# Patient Record
Sex: Female | Born: 1935 | Race: White | Hispanic: No | Smoking: Former smoker
Health system: Southern US, Community
[De-identification: ages and names within clinical notes are randomized; demographics above are authoritative.]

## PROBLEM LIST (undated history)

## (undated) DIAGNOSIS — J189 Pneumonia, unspecified organism: Secondary | ICD-10-CM

## (undated) DIAGNOSIS — S3992XA Unspecified injury of lower back, initial encounter: Secondary | ICD-10-CM

## (undated) DIAGNOSIS — I4891 Unspecified atrial fibrillation: Secondary | ICD-10-CM

## (undated) DIAGNOSIS — F0390 Unspecified dementia without behavioral disturbance: Secondary | ICD-10-CM

## (undated) HISTORY — PX: CHOLECYSTECTOMY: SHX55

## (undated) HISTORY — PX: ABDOMINAL HYSTERECTOMY: SHX81

---

## 1997-11-13 ENCOUNTER — Other Ambulatory Visit: Admission: RE | Admit: 1997-11-13 | Discharge: 1997-11-13 | Payer: Self-pay | Admitting: Internal Medicine

## 1999-06-09 ENCOUNTER — Encounter: Payer: Self-pay | Admitting: Internal Medicine

## 1999-06-09 ENCOUNTER — Encounter: Admission: RE | Admit: 1999-06-09 | Discharge: 1999-06-09 | Payer: Self-pay | Admitting: Internal Medicine

## 2000-12-13 ENCOUNTER — Other Ambulatory Visit: Admission: RE | Admit: 2000-12-13 | Discharge: 2000-12-13 | Payer: Self-pay | Admitting: Internal Medicine

## 2005-04-30 ENCOUNTER — Other Ambulatory Visit: Admission: RE | Admit: 2005-04-30 | Discharge: 2005-04-30 | Payer: Self-pay | Admitting: Internal Medicine

## 2005-04-30 LAB — HM PAP SMEAR

## 2006-11-01 ENCOUNTER — Encounter: Admission: RE | Admit: 2006-11-01 | Discharge: 2006-11-01 | Payer: Self-pay | Admitting: Internal Medicine

## 2006-11-01 LAB — HM MAMMOGRAPHY

## 2007-05-06 ENCOUNTER — Encounter: Admission: RE | Admit: 2007-05-06 | Discharge: 2007-05-06 | Payer: Self-pay | Admitting: Internal Medicine

## 2008-06-25 ENCOUNTER — Ambulatory Visit: Payer: Self-pay | Admitting: Internal Medicine

## 2011-01-27 ENCOUNTER — Encounter: Payer: Self-pay | Admitting: Internal Medicine

## 2011-01-27 ENCOUNTER — Ambulatory Visit (INDEPENDENT_AMBULATORY_CARE_PROVIDER_SITE_OTHER): Payer: Medicare Other | Admitting: Internal Medicine

## 2011-01-27 VITALS — BP 128/70 | HR 80 | Temp 99.4°F | Ht 60.75 in | Wt 113.0 lb

## 2011-01-27 DIAGNOSIS — H8110 Benign paroxysmal vertigo, unspecified ear: Secondary | ICD-10-CM

## 2011-01-27 DIAGNOSIS — M549 Dorsalgia, unspecified: Secondary | ICD-10-CM

## 2011-01-27 LAB — POCT URINALYSIS DIPSTICK
Bilirubin, UA: NEGATIVE
Blood, UA: NEGATIVE
Glucose, UA: NEGATIVE
Ketones, UA: NEGATIVE
Leukocytes, UA: NEGATIVE
Nitrite, UA: NEGATIVE
Protein, UA: NEGATIVE
Spec Grav, UA: 1.02
Urobilinogen, UA: NEGATIVE
pH, UA: 7

## 2011-01-30 ENCOUNTER — Other Ambulatory Visit: Payer: Medicare Other | Admitting: Internal Medicine

## 2011-01-30 DIAGNOSIS — R5381 Other malaise: Secondary | ICD-10-CM

## 2011-01-30 DIAGNOSIS — R5383 Other fatigue: Secondary | ICD-10-CM

## 2011-01-30 DIAGNOSIS — Z79899 Other long term (current) drug therapy: Secondary | ICD-10-CM

## 2011-01-30 LAB — COMPREHENSIVE METABOLIC PANEL
ALT: 16 U/L (ref 0–35)
AST: 20 U/L (ref 0–37)
Albumin: 4.1 g/dL (ref 3.5–5.2)
Alkaline Phosphatase: 60 U/L (ref 39–117)
BUN: 14 mg/dL (ref 6–23)
CO2: 29 mEq/L (ref 19–32)
Calcium: 10.4 mg/dL (ref 8.4–10.5)
Chloride: 104 mEq/L (ref 96–112)
Creat: 0.82 mg/dL (ref 0.50–1.10)
Glucose, Bld: 87 mg/dL (ref 70–99)
Potassium: 4.7 mEq/L (ref 3.5–5.3)
Sodium: 142 mEq/L (ref 135–145)
Total Bilirubin: 0.5 mg/dL (ref 0.3–1.2)
Total Protein: 6 g/dL (ref 6.0–8.3)

## 2011-01-30 LAB — CBC WITH DIFFERENTIAL/PLATELET
Basophils Absolute: 0.1 10*3/uL (ref 0.0–0.1)
Basophils Relative: 1 % (ref 0–1)
Eosinophils Absolute: 0.1 10*3/uL (ref 0.0–0.7)
Eosinophils Relative: 2 % (ref 0–5)
HCT: 37.5 % (ref 36.0–46.0)
Hemoglobin: 12.3 g/dL (ref 12.0–15.0)
Lymphocytes Relative: 24 % (ref 12–46)
Lymphs Abs: 1.4 10*3/uL (ref 0.7–4.0)
MCH: 30.1 pg (ref 26.0–34.0)
MCHC: 32.8 g/dL (ref 30.0–36.0)
MCV: 91.7 fL (ref 78.0–100.0)
Monocytes Absolute: 0.6 10*3/uL (ref 0.1–1.0)
Monocytes Relative: 9 % (ref 3–12)
Neutro Abs: 3.7 10*3/uL (ref 1.7–7.7)
Neutrophils Relative %: 63 % (ref 43–77)
Platelets: 217 10*3/uL (ref 150–400)
RBC: 4.09 MIL/uL (ref 3.87–5.11)
RDW: 13.5 % (ref 11.5–15.5)
WBC: 5.9 10*3/uL (ref 4.0–10.5)

## 2011-01-30 LAB — LIPID PANEL
Cholesterol: 191 mg/dL (ref 0–200)
HDL: 64 mg/dL (ref >39)
LDL Cholesterol: 111 mg/dL — ABNORMAL HIGH (ref 0–99)
Triglycerides: 82 mg/dL (ref <150)
VLDL: 16 mg/dL (ref 0–40)

## 2011-02-16 ENCOUNTER — Encounter: Payer: Self-pay | Admitting: Internal Medicine

## 2011-02-16 NOTE — Patient Instructions (Signed)
Take anti-inflammatory if needed for back pain. Otherwise treat conservatively. May take Antivert 25 mg at bedtime for dizziness. Be careful with position change. Come in soon for fasting lab work.

## 2011-02-16 NOTE — Progress Notes (Signed)
  Subjective:    Patient ID: Amanda Atkins, female    DOB: 08/11/1924, 77 y.o.   MRN: [id number]  HPI generally healthy 77 year old white female who takes lots of vitamin supplements in today complaining of some back pain and positional vertigo.    Review of Systems noncontributory except for some lightheadedness with position change. Pain in right paralumbar area     Objective:   Physical Exam straight leg raising is negative at 90 bilaterally; deep tendon reflexes 2+ and symmetrical in the knees-1+ and symmetrical in the ankles. Muscle strength in the lower extremities is 5 over 5 in all groups tested. Sensation is intact in the lower extremities. No radiculopathy symptoms. She has pain over her right posterior superior iliac spine. Brief neurological exam shows no focal deficits. Cranial nerves II through XII are grossly intact. There is no nystagmus. Chest is clear; cardiac exam regular rate and rhythm.        Assessment & Plan:  Benign positional vertigo  Musculoskeletal low back pain  Plan: Patient may use heating pad on back, if symptoms persist refer to physical therapy. May take Aleve over-the-counter for musculoskeletal pain. For vertigo, may take Antivert 25 mg at bedtime. Be careful with position change. Patient will return soon for fasting lab work. We've not done lab work on her in some time and have not seen her in some time.

## 2011-04-27 DIAGNOSIS — S060X9A Concussion with loss of consciousness of unspecified duration, initial encounter: Secondary | ICD-10-CM | POA: Diagnosis not present

## 2011-04-27 DIAGNOSIS — S0993XA Unspecified injury of face, initial encounter: Secondary | ICD-10-CM | POA: Diagnosis not present

## 2011-04-27 DIAGNOSIS — S32009A Unspecified fracture of unspecified lumbar vertebra, initial encounter for closed fracture: Secondary | ICD-10-CM | POA: Diagnosis not present

## 2011-04-27 DIAGNOSIS — S199XXA Unspecified injury of neck, initial encounter: Secondary | ICD-10-CM | POA: Diagnosis not present

## 2011-04-27 DIAGNOSIS — IMO0002 Reserved for concepts with insufficient information to code with codable children: Secondary | ICD-10-CM | POA: Diagnosis not present

## 2011-04-27 DIAGNOSIS — M542 Cervicalgia: Secondary | ICD-10-CM | POA: Diagnosis not present

## 2011-04-27 DIAGNOSIS — R937 Abnormal findings on diagnostic imaging of other parts of musculoskeletal system: Secondary | ICD-10-CM | POA: Diagnosis not present

## 2011-04-28 DIAGNOSIS — S22009A Unspecified fracture of unspecified thoracic vertebra, initial encounter for closed fracture: Secondary | ICD-10-CM | POA: Diagnosis present

## 2011-04-28 DIAGNOSIS — Z9181 History of falling: Secondary | ICD-10-CM | POA: Diagnosis not present

## 2011-04-28 DIAGNOSIS — X58XXXA Exposure to other specified factors, initial encounter: Secondary | ICD-10-CM | POA: Diagnosis not present

## 2011-04-28 DIAGNOSIS — S32009A Unspecified fracture of unspecified lumbar vertebra, initial encounter for closed fracture: Secondary | ICD-10-CM | POA: Diagnosis not present

## 2011-05-08 DIAGNOSIS — S32009A Unspecified fracture of unspecified lumbar vertebra, initial encounter for closed fracture: Secondary | ICD-10-CM | POA: Diagnosis not present

## 2011-05-08 DIAGNOSIS — R269 Unspecified abnormalities of gait and mobility: Secondary | ICD-10-CM | POA: Diagnosis not present

## 2011-05-12 ENCOUNTER — Other Ambulatory Visit: Payer: Self-pay | Admitting: Neurology

## 2011-05-12 DIAGNOSIS — R269 Unspecified abnormalities of gait and mobility: Secondary | ICD-10-CM

## 2011-05-12 DIAGNOSIS — S32009A Unspecified fracture of unspecified lumbar vertebra, initial encounter for closed fracture: Secondary | ICD-10-CM

## 2011-05-18 ENCOUNTER — Ambulatory Visit
Admission: RE | Admit: 2011-05-18 | Discharge: 2011-05-18 | Disposition: A | Discharge status: Home / Self Care | Payer: Medicare Other | Source: Ambulatory Visit | Attending: Neurology | Admitting: Neurology

## 2011-05-18 DIAGNOSIS — S32009A Unspecified fracture of unspecified lumbar vertebra, initial encounter for closed fracture: Secondary | ICD-10-CM | POA: Diagnosis not present

## 2011-05-18 DIAGNOSIS — R269 Unspecified abnormalities of gait and mobility: Secondary | ICD-10-CM

## 2011-05-28 ENCOUNTER — Ambulatory Visit: Payer: Medicare Other | Attending: Neurology | Admitting: Physical Therapy

## 2011-05-28 DIAGNOSIS — IMO0001 Reserved for inherently not codable concepts without codable children: Secondary | ICD-10-CM | POA: Diagnosis not present

## 2011-05-28 DIAGNOSIS — M545 Low back pain, unspecified: Secondary | ICD-10-CM | POA: Insufficient documentation

## 2011-05-28 DIAGNOSIS — R5381 Other malaise: Secondary | ICD-10-CM | POA: Insufficient documentation

## 2011-06-02 ENCOUNTER — Ambulatory Visit: Payer: Medicare Other | Admitting: Physical Therapy

## 2011-06-04 ENCOUNTER — Encounter: Payer: Medicare Other | Admitting: Physical Therapy

## 2011-06-04 ENCOUNTER — Ambulatory Visit: Payer: Medicare Other | Admitting: Physical Therapy

## 2011-06-09 ENCOUNTER — Ambulatory Visit: Payer: Medicare Other | Admitting: Physical Therapy

## 2011-06-11 ENCOUNTER — Ambulatory Visit: Payer: Medicare Other | Admitting: Physical Therapy

## 2011-06-16 ENCOUNTER — Ambulatory Visit: Payer: Medicare Other | Admitting: Physical Therapy

## 2011-06-18 ENCOUNTER — Ambulatory Visit: Payer: Medicare Other | Attending: Neurology | Admitting: Physical Therapy

## 2011-06-18 ENCOUNTER — Encounter: Payer: Medicare Other | Admitting: Physical Therapy

## 2011-06-18 DIAGNOSIS — R5381 Other malaise: Secondary | ICD-10-CM | POA: Diagnosis not present

## 2011-06-18 DIAGNOSIS — M545 Low back pain, unspecified: Secondary | ICD-10-CM | POA: Insufficient documentation

## 2011-06-18 DIAGNOSIS — IMO0001 Reserved for inherently not codable concepts without codable children: Secondary | ICD-10-CM | POA: Insufficient documentation

## 2011-06-23 ENCOUNTER — Encounter: Payer: Medicare Other | Admitting: Physical Therapy

## 2011-06-25 ENCOUNTER — Encounter: Payer: Medicare Other | Admitting: Physical Therapy

## 2011-06-30 ENCOUNTER — Encounter: Payer: Medicare Other | Admitting: Physical Therapy

## 2011-07-02 ENCOUNTER — Encounter: Payer: Medicare Other | Admitting: Physical Therapy

## 2011-07-16 ENCOUNTER — Encounter (HOSPITAL_COMMUNITY): Payer: Self-pay

## 2011-07-16 ENCOUNTER — Emergency Department (HOSPITAL_COMMUNITY)
Admission: EM | Admit: 2011-07-16 | Discharge: 2011-07-16 | Discharge status: Against Medical Advice | Payer: Medicare Other | Attending: Emergency Medicine | Admitting: Emergency Medicine

## 2011-07-16 ENCOUNTER — Other Ambulatory Visit: Payer: Self-pay

## 2011-07-16 ENCOUNTER — Telehealth: Payer: Self-pay | Admitting: Internal Medicine

## 2011-07-16 DIAGNOSIS — I959 Hypotension, unspecified: Secondary | ICD-10-CM | POA: Insufficient documentation

## 2011-07-16 DIAGNOSIS — R61 Generalized hyperhidrosis: Secondary | ICD-10-CM | POA: Insufficient documentation

## 2011-07-16 DIAGNOSIS — R404 Transient alteration of awareness: Secondary | ICD-10-CM | POA: Diagnosis not present

## 2011-07-16 DIAGNOSIS — R55 Syncope and collapse: Secondary | ICD-10-CM | POA: Diagnosis not present

## 2011-07-16 DIAGNOSIS — R42 Dizziness and giddiness: Secondary | ICD-10-CM | POA: Diagnosis not present

## 2011-07-16 DIAGNOSIS — Z79899 Other long term (current) drug therapy: Secondary | ICD-10-CM | POA: Diagnosis not present

## 2011-07-16 HISTORY — DX: Unspecified injury of lower back, initial encounter: S39.92XA

## 2011-07-16 LAB — URINE MICROSCOPIC-ADD ON

## 2011-07-16 LAB — CBC
HCT: 34 % — ABNORMAL LOW (ref 36.0–46.0)
Hemoglobin: 11.7 g/dL — ABNORMAL LOW (ref 12.0–15.0)
MCV: 88.8 fL (ref 78.0–100.0)
RDW: 13.8 % (ref 11.5–15.5)
WBC: 5.1 10*3/uL (ref 4.0–10.5)

## 2011-07-16 LAB — URINALYSIS, ROUTINE W REFLEX MICROSCOPIC
Bilirubin Urine: NEGATIVE
Glucose, UA: NEGATIVE mg/dL
Hgb urine dipstick: NEGATIVE
Ketones, ur: 15 mg/dL — AB
Specific Gravity, Urine: 1.026 (ref 1.005–1.030)
pH: 6 (ref 5.0–8.0)

## 2011-07-16 LAB — BASIC METABOLIC PANEL
BUN: 14 mg/dL (ref 6–23)
CO2: 27 mEq/L (ref 19–32)
Chloride: 101 mEq/L (ref 96–112)
Creatinine, Ser: 0.75 mg/dL (ref 0.50–1.10)
GFR calc Af Amer: 88 mL/min — ABNORMAL LOW (ref >90)
Glucose, Bld: 189 mg/dL — ABNORMAL HIGH (ref 70–99)
Potassium: 3.7 mEq/L (ref 3.5–5.1)

## 2011-07-16 LAB — POCT I-STAT TROPONIN I

## 2011-07-16 MED ORDER — SODIUM CHLORIDE 0.9 % IV BOLUS (SEPSIS)
500.0000 mL | Freq: Once | INTRAVENOUS | Status: AC
  Administered 2011-07-16: 500 mL via INTRAVENOUS

## 2011-07-16 NOTE — ED Notes (Addendum)
Dr. Karma Ganja spoke to the patient and family and recommended that the patient be admitted to the hospital but the patient adamantly refuses to stay. Dr. Karma Ganja explained to the patient the risks such as irregular heart beat, recurrent hypotension, heart attack, and possible death which the patient understood.  Pt stated that she is going to call and make an appointment with her physician. Pt is A/A/Ox4, skin is warm and dry, respiration is even and unlabored.
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
## 2011-07-16 NOTE — ED Notes (Signed)
Pt ambulated to the bathroom without any problems.
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
## 2011-07-16 NOTE — ED Notes (Signed)
Pt is A/A/OX4, skin is warm and dry, respiration and unlabored.
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
## 2011-08-10 DIAGNOSIS — S32009A Unspecified fracture of unspecified lumbar vertebra, initial encounter for closed fracture: Secondary | ICD-10-CM | POA: Diagnosis not present

## 2011-08-10 DIAGNOSIS — R269 Unspecified abnormalities of gait and mobility: Secondary | ICD-10-CM | POA: Diagnosis not present

## 2011-09-08 DIAGNOSIS — H905 Unspecified sensorineural hearing loss: Secondary | ICD-10-CM | POA: Diagnosis not present

## 2012-02-16 DIAGNOSIS — H6120 Impacted cerumen, unspecified ear: Secondary | ICD-10-CM | POA: Diagnosis not present

## 2012-02-16 DIAGNOSIS — H9210 Otorrhea, unspecified ear: Secondary | ICD-10-CM | POA: Diagnosis not present

## 2012-02-16 DIAGNOSIS — H60399 Other infective otitis externa, unspecified ear: Secondary | ICD-10-CM | POA: Diagnosis not present

## 2012-02-16 DIAGNOSIS — H903 Sensorineural hearing loss, bilateral: Secondary | ICD-10-CM | POA: Diagnosis not present

## 2012-03-01 DIAGNOSIS — H903 Sensorineural hearing loss, bilateral: Secondary | ICD-10-CM | POA: Diagnosis not present

## 2012-03-01 DIAGNOSIS — H60399 Other infective otitis externa, unspecified ear: Secondary | ICD-10-CM | POA: Diagnosis not present

## 2012-07-04 DIAGNOSIS — L219 Seborrheic dermatitis, unspecified: Secondary | ICD-10-CM | POA: Diagnosis not present

## 2012-12-01 ENCOUNTER — Encounter: Payer: Self-pay | Admitting: Internal Medicine

## 2012-12-01 ENCOUNTER — Ambulatory Visit (INDEPENDENT_AMBULATORY_CARE_PROVIDER_SITE_OTHER): Payer: Medicare Other | Admitting: Internal Medicine

## 2012-12-01 VITALS — BP 138/86 | HR 80 | Temp 98.9°F | Ht 60.0 in | Wt 109.0 lb

## 2012-12-01 DIAGNOSIS — K644 Residual hemorrhoidal skin tags: Secondary | ICD-10-CM | POA: Diagnosis not present

## 2012-12-01 DIAGNOSIS — L259 Unspecified contact dermatitis, unspecified cause: Secondary | ICD-10-CM | POA: Diagnosis not present

## 2012-12-01 DIAGNOSIS — L309 Dermatitis, unspecified: Secondary | ICD-10-CM

## 2012-12-01 NOTE — Addendum Note (Signed)
Addended by: Fayne Mediate on: 12/01/2012 02:30 PM   Modules accepted: Orders

## 2012-12-01 NOTE — Patient Instructions (Signed)
Use ProctoCream-HC in rectal area and perineum 4 times daily until dermatitis and hemorrhoids have healed

## 2012-12-01 NOTE — Progress Notes (Signed)
  Subjective:    Patient ID: Amanda Atkins, female    DOB: [birth year], 79 y.o.   MRN: [id number]  HPI 79 year old white female seen infrequently  here. Says she has had issues with hemorrhoids for the past 2 weeks. Denies being constipated. Takes a lot of vitamins supplements. Probably has some dementia. Continues to drive which is a bit worrisome. She lives alone. Her twin sister is in a nursing facility with dementia.    Review of Systems     Objective:   Physical Exam Rectal exam: Loose stool in rectal vault which is guaiac negative. She has prominent external hemorrhoids that appear to be inflamed but are not actively bleeding. She has excoriations on perineum surrounding the hemorrhoids.       Assessment & Plan:  External hemorrhoids  Excoriations on perineum  Plan: ProctoCream-HC to use in rectal area and perineum 4 times a day until healed. Has when necessary one year refills on the cream

## 2012-12-12 DIAGNOSIS — L259 Unspecified contact dermatitis, unspecified cause: Secondary | ICD-10-CM | POA: Diagnosis not present

## 2013-01-03 ENCOUNTER — Ambulatory Visit (INDEPENDENT_AMBULATORY_CARE_PROVIDER_SITE_OTHER): Payer: Medicare Other | Admitting: Internal Medicine

## 2013-01-03 ENCOUNTER — Encounter: Payer: Self-pay | Admitting: Internal Medicine

## 2013-01-03 VITALS — BP 126/72 | HR 76 | Temp 98.8°F | Ht 60.75 in | Wt 107.0 lb

## 2013-01-03 DIAGNOSIS — B373 Candidiasis of vulva and vagina: Secondary | ICD-10-CM

## 2013-01-03 DIAGNOSIS — L853 Xerosis cutis: Secondary | ICD-10-CM

## 2013-01-03 DIAGNOSIS — L258 Unspecified contact dermatitis due to other agents: Secondary | ICD-10-CM | POA: Diagnosis not present

## 2013-01-04 NOTE — Progress Notes (Signed)
  Subjective:    Patient ID: Amanda Atkins, female    DOB: 03-15-1924, 79 y.o.   MRN: [id number]  HPI Patient complaining of itching in genital area. Says she has a rash. Has had some dysuria. Also complaining of rash on her arms and lower back. This is less itchy. She has dermatitis of the face being treated by Dr. Terri Piedra.    Review of Systems     Objective:   Physical Exam Patient has generalized erythema of the external genital area with satellite lesions consistent with Candida. She has very dry skin on her arms and lower back. Has some erythematous discrete papular areas on forearms bilaterally. Lower back mainly has dry skin that is rough        Assessment & Plan:   Dermatitis back and forearms  Candida infection in genital area  Plan: Lotrisone cream to use twice daily in genital area until healed. Triamcinolone 0.1% 60 g in 4 ounces of use her in cream to use on forearms and lower back 3 times a day sparingly. If symptoms persist, refer to Dr. Terri Piedra  25 minutes spent with patient explaining different types of dermatitis and treatment as well as examining patient.

## 2013-01-04 NOTE — Patient Instructions (Signed)
Use triamcinolone cream and Eucerin on forearms and back 3 times a day sparingly. Use Lotrisone cream and genital area twice daily until rash is healed

## 2013-07-13 ENCOUNTER — Ambulatory Visit (INDEPENDENT_AMBULATORY_CARE_PROVIDER_SITE_OTHER): Payer: Medicare Other

## 2013-07-13 ENCOUNTER — Encounter: Payer: Self-pay | Admitting: Podiatry

## 2013-07-13 ENCOUNTER — Ambulatory Visit (INDEPENDENT_AMBULATORY_CARE_PROVIDER_SITE_OTHER): Payer: Medicare Other | Admitting: Podiatry

## 2013-07-13 VITALS — BP 128/80 | HR 65 | Resp 16

## 2013-07-13 DIAGNOSIS — M2040 Other hammer toe(s) (acquired), unspecified foot: Secondary | ICD-10-CM

## 2013-07-13 DIAGNOSIS — L84 Corns and callosities: Secondary | ICD-10-CM | POA: Diagnosis not present

## 2013-07-13 NOTE — Progress Notes (Signed)
   Subjective:    Patient ID: Amanda Atkins, female    DOB: 1925-01-20, 80 y.o.   MRN: [id number]  HPI Comments: "I had a pain in this right foot but its actually better now"  Patient c/o callused area plantar forefoot and 5th toe right about 3-4 weeks. The area is not sore currently, but would still like it checked. She has not had any treatment, home or professional.  Foot Pain      Review of Systems  HENT: Positive for sinus pressure.   Allergic/Immunologic: Positive for food allergies.  All other systems reviewed and are negative.      Objective:   Physical Exam        Assessment & Plan:

## 2013-07-13 NOTE — Progress Notes (Signed)
Subjective:     Patient ID: Amanda Atkins, female   DOB: July 29, 1924, 80 y.o.   MRN: [id number]  Foot Pain   patient presents concerned because she's had pain around the plantar of her right foot and thinks there is something under there but she cannot see it due to her inability to see the bottom of her foot   Review of Systems  All other systems reviewed and are negative.      Objective:   Physical Exam  Nursing note and vitals reviewed. Constitutional: She is oriented to person, place, and time.  Cardiovascular: Intact distal pulses.   Musculoskeletal: Normal range of motion.  Neurological: She is oriented to person, place, and time.  Skin: Skin is dry.   neurovascular status diminished but intact with patient having diminished range of motion and diminished muscle strength normal for her age. She has mild edema in her ankle region and digital perfusion is good with a keratotic lesion underneath the right foot around the distal fifth metatarsal in a localized area with no proximal erythema edema or drainage     Assessment:     Inflammatory tendinitis with possible stress fracture and a lesion which appears to be due to pressure    Plan:     H&P and x-rays reviewed. Area debrided and no breakdown of tissue noted. Patient will wear supportive shoes and reappoint as needed

## 2014-06-18 DIAGNOSIS — H7311 Chronic myringitis, right ear: Secondary | ICD-10-CM | POA: Diagnosis not present

## 2014-06-18 DIAGNOSIS — H6123 Impacted cerumen, bilateral: Secondary | ICD-10-CM | POA: Diagnosis not present

## 2014-06-18 DIAGNOSIS — H903 Sensorineural hearing loss, bilateral: Secondary | ICD-10-CM | POA: Diagnosis not present

## 2014-07-30 DIAGNOSIS — H6123 Impacted cerumen, bilateral: Secondary | ICD-10-CM | POA: Diagnosis not present

## 2014-07-30 DIAGNOSIS — H903 Sensorineural hearing loss, bilateral: Secondary | ICD-10-CM | POA: Diagnosis not present

## 2014-07-30 DIAGNOSIS — H7311 Chronic myringitis, right ear: Secondary | ICD-10-CM | POA: Diagnosis not present

## 2014-08-28 DIAGNOSIS — H2513 Age-related nuclear cataract, bilateral: Secondary | ICD-10-CM | POA: Diagnosis not present

## 2014-09-04 DIAGNOSIS — H18411 Arcus senilis, right eye: Secondary | ICD-10-CM | POA: Diagnosis not present

## 2014-09-04 DIAGNOSIS — H02839 Dermatochalasis of unspecified eye, unspecified eyelid: Secondary | ICD-10-CM | POA: Diagnosis not present

## 2014-09-04 DIAGNOSIS — H2511 Age-related nuclear cataract, right eye: Secondary | ICD-10-CM | POA: Diagnosis not present

## 2014-09-04 DIAGNOSIS — H524 Presbyopia: Secondary | ICD-10-CM | POA: Diagnosis not present

## 2014-09-04 DIAGNOSIS — H18412 Arcus senilis, left eye: Secondary | ICD-10-CM | POA: Diagnosis not present

## 2014-09-07 DIAGNOSIS — S81002A Unspecified open wound, left knee, initial encounter: Secondary | ICD-10-CM | POA: Diagnosis not present

## 2014-09-07 DIAGNOSIS — S8000XA Contusion of unspecified knee, initial encounter: Secondary | ICD-10-CM | POA: Diagnosis not present

## 2014-11-05 DIAGNOSIS — H2511 Age-related nuclear cataract, right eye: Secondary | ICD-10-CM | POA: Diagnosis not present

## 2014-11-05 DIAGNOSIS — H25011 Cortical age-related cataract, right eye: Secondary | ICD-10-CM | POA: Diagnosis not present

## 2014-11-05 DIAGNOSIS — H269 Unspecified cataract: Secondary | ICD-10-CM | POA: Diagnosis not present

## 2014-11-06 DIAGNOSIS — H25012 Cortical age-related cataract, left eye: Secondary | ICD-10-CM | POA: Diagnosis not present

## 2014-11-06 DIAGNOSIS — H2512 Age-related nuclear cataract, left eye: Secondary | ICD-10-CM | POA: Diagnosis not present

## 2014-11-19 ENCOUNTER — Encounter: Payer: Self-pay | Admitting: Internal Medicine

## 2014-11-19 DIAGNOSIS — H269 Unspecified cataract: Secondary | ICD-10-CM | POA: Diagnosis not present

## 2014-11-19 DIAGNOSIS — H2512 Age-related nuclear cataract, left eye: Secondary | ICD-10-CM | POA: Diagnosis not present

## 2014-11-19 DIAGNOSIS — H25012 Cortical age-related cataract, left eye: Secondary | ICD-10-CM | POA: Diagnosis not present

## 2014-11-19 DIAGNOSIS — Z961 Presence of intraocular lens: Secondary | ICD-10-CM | POA: Diagnosis not present

## 2014-11-19 DIAGNOSIS — H52202 Unspecified astigmatism, left eye: Secondary | ICD-10-CM | POA: Diagnosis not present

## 2014-11-26 ENCOUNTER — Ambulatory Visit: Payer: Medicare Other | Admitting: Internal Medicine

## 2014-11-29 ENCOUNTER — Encounter: Payer: Self-pay | Admitting: Internal Medicine

## 2014-11-29 ENCOUNTER — Ambulatory Visit (INDEPENDENT_AMBULATORY_CARE_PROVIDER_SITE_OTHER): Payer: Medicare Other | Admitting: Internal Medicine

## 2014-11-29 VITALS — BP 106/70 | HR 79 | Temp 99.3°F | Ht 60.0 in | Wt 99.5 lb

## 2014-11-29 DIAGNOSIS — I4891 Unspecified atrial fibrillation: Secondary | ICD-10-CM | POA: Diagnosis not present

## 2014-11-29 DIAGNOSIS — I480 Paroxysmal atrial fibrillation: Secondary | ICD-10-CM | POA: Insufficient documentation

## 2014-11-29 LAB — COMPREHENSIVE METABOLIC PANEL
ALBUMIN: 4.1 g/dL (ref 3.6–5.1)
ALT: 19 U/L (ref 6–29)
AST: 18 U/L (ref 10–35)
Alkaline Phosphatase: 62 U/L (ref 33–130)
BILIRUBIN TOTAL: 0.4 mg/dL (ref 0.2–1.2)
BUN: 13 mg/dL (ref 7–25)
CALCIUM: 9.4 mg/dL (ref 8.6–10.4)
CHLORIDE: 103 mmol/L (ref 98–110)
CO2: 28 mmol/L (ref 20–31)
Creat: 0.76 mg/dL (ref 0.60–0.88)
GLUCOSE: 99 mg/dL (ref 65–99)
POTASSIUM: 4.2 mmol/L (ref 3.5–5.3)
Sodium: 140 mmol/L (ref 135–146)
Total Protein: 6.4 g/dL (ref 6.1–8.1)

## 2014-11-29 LAB — CBC WITH DIFFERENTIAL/PLATELET
Basophils Absolute: 0 10*3/uL (ref 0.0–0.1)
Basophils Relative: 0 % (ref 0–1)
EOS PCT: 1 % (ref 0–5)
Eosinophils Absolute: 0.1 10*3/uL (ref 0.0–0.7)
HEMATOCRIT: 36.1 % (ref 36.0–46.0)
HEMOGLOBIN: 12 g/dL (ref 12.0–15.0)
LYMPHS ABS: 1.1 10*3/uL (ref 0.7–4.0)
LYMPHS PCT: 12 % (ref 12–46)
MCH: 30.2 pg (ref 26.0–34.0)
MCHC: 33.2 g/dL (ref 30.0–36.0)
MCV: 90.7 fL (ref 78.0–100.0)
MONO ABS: 1.3 10*3/uL — AB (ref 0.1–1.0)
MONOS PCT: 14 % — AB (ref 3–12)
MPV: 9.9 fL (ref 8.6–12.4)
NEUTROS ABS: 6.8 10*3/uL (ref 1.7–7.7)
Neutrophils Relative %: 73 % (ref 43–77)
Platelets: 165 10*3/uL (ref 150–400)
RBC: 3.98 MIL/uL (ref 3.87–5.11)
RDW: 14.2 % (ref 11.5–15.5)
WBC: 9.3 10*3/uL (ref 4.0–10.5)

## 2014-11-29 LAB — T4, FREE: Free T4: 1.2 ng/dL (ref 0.80–1.80)

## 2014-11-29 LAB — TSH: TSH: 0.386 u[IU]/mL (ref 0.350–4.500)
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
## 2014-11-30 ENCOUNTER — Telehealth: Payer: Self-pay | Admitting: *Deleted

## 2014-11-30 NOTE — Telephone Encounter (Signed)
Reviewed lab results with patient.

## 2014-12-03 ENCOUNTER — Ambulatory Visit (INDEPENDENT_AMBULATORY_CARE_PROVIDER_SITE_OTHER): Payer: Medicare Other

## 2014-12-03 DIAGNOSIS — I480 Paroxysmal atrial fibrillation: Secondary | ICD-10-CM

## 2014-12-10 ENCOUNTER — Ambulatory Visit (INDEPENDENT_AMBULATORY_CARE_PROVIDER_SITE_OTHER): Payer: Medicare Other | Admitting: Internal Medicine

## 2014-12-10 ENCOUNTER — Encounter: Payer: Self-pay | Admitting: Internal Medicine

## 2014-12-10 VITALS — BP 122/82 | HR 109 | Temp 99.9°F | Ht 60.0 in | Wt 103.0 lb

## 2014-12-10 DIAGNOSIS — J069 Acute upper respiratory infection, unspecified: Secondary | ICD-10-CM

## 2014-12-10 MED ORDER — BENZONATATE 100 MG PO CAPS: 100.0000 mg | ORAL_CAPSULE | Freq: Three times a day (TID) | ORAL | Status: DC

## 2014-12-10 NOTE — Progress Notes (Signed)
   Subjective:    Patient ID: Maren BeachLouise C Guardiola, female    DOB: 01/12/1925, 81 y.o.   MRN: [id number]  HPI Onset of URI symptoms Friday, October 21. Has had  cough that is nonproductive. Daughter was worried about her. Sounds nasally congested. No fever or shaking chills. No sore throat. No recent URIs. Seldom sick. Believes in holistic measures. Recently his been taking vitamin C and drinking raspberry tea. Without town this week with friends.    Review of Systems     Objective:   Physical Exam  Skin warm and dry. Small anterior cervical nodes bilaterally. Chest clear to auscultation without rales or wheezing. Sounds nasally congested and has a congested cough. TMs are chronically scarred and dull. Pharynx not injected.      Assessment & Plan:  Acute URI  Plan: Tessalon Perles 100 mg 3 times daily as needed for cough. Continue vitamin C and drink plenty of fluids. Call if not better by Thursday, October 27

## 2014-12-10 NOTE — Patient Instructions (Signed)
Drink plenty of fluids. Get plenty of rest this week. Continue vitamin C. Tessalon Perles 100 mg 3 times daily as needed for cough. Call if not better by Thursday, October 27

## 2014-12-12 ENCOUNTER — Telehealth: Payer: Self-pay | Admitting: Internal Medicine

## 2014-12-12 MED ORDER — AZITHROMYCIN 250 MG PO TABS: ORAL_TABLET | ORAL | Status: DC

## 2014-12-12 NOTE — Telephone Encounter (Signed)
You had asked patient to call with an update today.  States she is coughing a lot at night; describes it as a dry cough.  States she is blowing her nose a lot, but mostly clear.  States she isn't feeling much better.  States she doesn't feel well at all.  No difficulty breathing, no pain to describe.  Just doesn't feel well, no body aches (except in her back since she started coughing).  No fever or chills.  Her appetite is fine.    Please advise and thanks.

## 2014-12-12 NOTE — Telephone Encounter (Signed)
Please send  in a  Generic Zithromax Z-pak  with directions: 2 po day 1 followed by one po days 2-5 ( #6 tabs) and let patient know.

## 2014-12-15 NOTE — Patient Instructions (Signed)
24 hour Holter monitor to be older. Take one aspirin daily.

## 2015-01-07 ENCOUNTER — Ambulatory Visit: Payer: Medicare Other | Admitting: Internal Medicine

## 2015-01-18 ENCOUNTER — Ambulatory Visit (INDEPENDENT_AMBULATORY_CARE_PROVIDER_SITE_OTHER): Payer: Medicare Other | Admitting: Internal Medicine

## 2015-01-18 ENCOUNTER — Encounter: Payer: Self-pay | Admitting: Internal Medicine

## 2015-01-18 VITALS — BP 130/72 | HR 117 | Temp 97.6°F | Resp 20 | Ht 60.0 in | Wt 101.0 lb

## 2015-01-18 DIAGNOSIS — I480 Paroxysmal atrial fibrillation: Secondary | ICD-10-CM

## 2015-01-18 NOTE — Progress Notes (Signed)
   Subjective:    Patient ID: Amanda Atkins, female    DOB: 23-Nov-1924, 81 y.o.   MRN: [id number]  HPI  Follow-up will paroxysmal atrial fibrillation. She did have a 24-hour Holter monitor which did confirm paroxysmal atrial fib. She is completely asymptomatic and cannot tell when she's in and out of atrial fib. Once again we discussed at length whether or not she wants to take chronic anticoagulation therapy. Has been taking just an aspirin daily.  She lives alone. She had one fall this past year. No injury with that fall. Continues to drive.    Review of Systems     Objective:   Physical Exam  Neck is supple without JVD thyromegaly or carotid bruits. Skin is warm and dry. Chest clear to auscultation. Cardiac exam irregular irregular rhythm consistent with atrial fibrillation. Extremities without edema. She is alert and oriented 3.      Assessment & Plan:  Paroxysmal atrial fib  Plan: After lengthy discussion we have agreed that she will just take aspirin one daily instead of being on chronic anticoagulation due to fall risk and fact she resides alone. Return as needed.

## 2015-01-18 NOTE — Patient Instructions (Signed)
Continue aspirin daily and return as needed.

## 2015-03-05 DIAGNOSIS — H903 Sensorineural hearing loss, bilateral: Secondary | ICD-10-CM | POA: Diagnosis not present

## 2015-03-05 DIAGNOSIS — H6122 Impacted cerumen, left ear: Secondary | ICD-10-CM | POA: Diagnosis not present

## 2015-04-26 ENCOUNTER — Encounter: Payer: Self-pay | Admitting: *Deleted

## 2015-05-09 ENCOUNTER — Ambulatory Visit (INDEPENDENT_AMBULATORY_CARE_PROVIDER_SITE_OTHER): Payer: Medicare Other | Admitting: Internal Medicine

## 2015-05-09 ENCOUNTER — Encounter: Payer: Self-pay | Admitting: Internal Medicine

## 2015-05-09 VITALS — BP 126/72 | HR 18 | Temp 98.5°F | Resp 20 | Ht 60.0 in | Wt 104.0 lb

## 2015-05-09 DIAGNOSIS — L539 Erythematous condition, unspecified: Secondary | ICD-10-CM | POA: Diagnosis not present

## 2015-05-09 DIAGNOSIS — T783XXA Angioneurotic edema, initial encounter: Secondary | ICD-10-CM | POA: Diagnosis not present

## 2015-05-09 MED ORDER — METHYLPREDNISOLONE ACETATE 80 MG/ML IJ SUSP
80.0000 mg | Freq: Once | INTRAMUSCULAR | Status: AC
  Administered 2015-05-09: 80 mg via INTRAMUSCULAR

## 2015-05-09 NOTE — Progress Notes (Signed)
   Subjective:    Patient ID: Amanda Atkins, female    DOB: [birth year], 82 y.o.   MRN: [id number]  HPI Patient takes a lot of vitamin supplements. She went out to eat dinner on Sunday, March 26 and subsequently came home and found that her face was very red and itchy. Did not think she ate anything unusual. Says that she had something similar happen several years ago with some redness of her nose and saw dermatologist who gave her an injection which she says did not help.    Review of Systems     Objective:   Physical Exam  She has generalized facial erythema. No bullous lesions noted. No other lesions on body with the exception of what appears to be perhaps tiny hemorrhagic area left thumb. If it does not resolve in the next couple of weeks she needs to let me know.      Assessment & Plan:  Generalized facial erythema without respiratory distress-likely allergic reaction but not sure what caused this.  Plan: Depo-Medrol 80 mg IM. Call with progress report next week. Call sooner if worse. Please make sure lesion on left thumb disappears and if not call back.

## 2015-05-09 NOTE — Patient Instructions (Signed)
Depo-Medrol 80 mg IM given. Call next week with progress report.

## 2015-05-13 ENCOUNTER — Telehealth: Payer: Self-pay

## 2015-05-13 MED ORDER — HYDROCORTISONE 2.5 % EX CREA: TOPICAL_CREAM | Freq: Two times a day (BID) | CUTANEOUS | Status: DC

## 2015-05-13 NOTE — Telephone Encounter (Signed)
Call in hydrocortisone 2.5% cream 30 grams to use sparing 3 times a day x one week.If not getting better in 2-3 days, needs to see Dermatologist

## 2015-05-13 NOTE — Telephone Encounter (Signed)
Patient contacted office today and states that the rash on her face is no better. She states that there is no change. Please advise. CB# [phone]

## 2015-05-13 NOTE — Telephone Encounter (Signed)
Patient has been notified

## 2016-06-02 ENCOUNTER — Telehealth: Payer: Self-pay | Admitting: Internal Medicine

## 2016-06-02 NOTE — Telephone Encounter (Signed)
Patient's daughter called to advised that her Mother needed to be seen this afternoon.  States that she was having abdominal pain.  Advised that we didn't have any appointments this afternoon for abdominal pain.  Advised that she would be best to take her to the emergency room for this.

## 2016-07-31 DIAGNOSIS — Z029 Encounter for administrative examinations, unspecified: Secondary | ICD-10-CM

## 2016-08-20 ENCOUNTER — Telehealth: Payer: Self-pay | Admitting: Internal Medicine

## 2016-08-20 NOTE — Telephone Encounter (Signed)
Patient states she has 2 areas on her face that are there and she can't get them to go away.  She is not complaining of pain or itching.  States they are just there and won't go away.  She cannot tell me the size or explain much more about them.  Says that the last time you saw her for this, you treated her for them and they cleared up and they went away.  Looking back in her history from 2014, it looks like she presented for for some issues for rash and she had some dermatitis that was being treated by Dr. Terri PiedraLupton on her face.  When asked, she said that doesn't recall this.  I offered to contact Dr. Dorita SciaraLupton's office since Dr. Lenord FellersBaxley is out of the office, patient doesn't want me to do that.  Advised I'll speak with Dr. Lenord FellersBaxley and relay her message and see what Dr. Lenord FellersBaxley wants me to do for her.  Patient states that will be fine with her.   Ok to refer to Dr. Terri PiedraLupton?

## 2016-08-20 NOTE — Telephone Encounter (Signed)
Left message for patient to plan to come in to see Dr. Lenord FellersBaxley on Monday, 7/9 at 11:00 a.m.  Patient instructed to call the office if this is not a good time/date.

## 2016-08-20 NOTE — Telephone Encounter (Signed)
OK to refer to Dr. Terri PiedraLupton.

## 2016-08-21 NOTE — Telephone Encounter (Signed)
Left another message today for patient to call and confirm her appointment time for Monday, 7/9.  Patient has not called back to confirm time as of yet.

## 2016-08-24 ENCOUNTER — Ambulatory Visit (INDEPENDENT_AMBULATORY_CARE_PROVIDER_SITE_OTHER): Payer: Medicare Other | Admitting: Internal Medicine

## 2016-08-24 ENCOUNTER — Encounter: Payer: Self-pay | Admitting: Internal Medicine

## 2016-08-24 VITALS — BP 110/78 | HR 93 | Temp 98.0°F | Wt 107.0 lb

## 2016-08-24 DIAGNOSIS — L219 Seborrheic dermatitis, unspecified: Secondary | ICD-10-CM

## 2016-08-24 DIAGNOSIS — L719 Rosacea, unspecified: Secondary | ICD-10-CM | POA: Diagnosis not present

## 2016-08-24 MED ORDER — KETOCONAZOLE 2 % EX CREA: 1.0000 "application " | TOPICAL_CREAM | Freq: Every day | CUTANEOUS | 0 refills | Status: DC

## 2016-08-24 NOTE — Progress Notes (Signed)
   Subjective:    Patient ID: Amanda Atkins, female    DOB: 1924/06/09, 83 y.o.   MRN: [id number]  HPI 83 year old White Female, who is seldom seen here, in today complaining of rash on her face. She can't say exactly when it started. She says she's had it before. Old records indicate that in 2014 Dr. Terri PiedraLupton was treating her for dermatitis of face.  Also complaining of hearing loss. She has hearing aids in says one is in the shop for repair and that she forgot about it.  Continues to drive which is of some concern at her age.  She resides alone.  Says she doesn't go out in the sun a lot and does wear sunscreen.    Review of Systems see above     Objective:   Physical Exam She has scaly rash left external ear. She has erythema that is mild upper cheek area bilaterally       Assessment & Plan:  Seborrheic dermatitis left external ear  Rosacea  Plan: MetroGel is expensive. We will try Nizoral cream to left external ear once daily. She is to let me know if not improved in 2-3 weeks.

## 2016-08-24 NOTE — Patient Instructions (Signed)
Try Nizoral cream sparingly to left external ear and cheeks daily for 2-3 weeks. Call with progress report.

## 2016-09-20 ENCOUNTER — Ambulatory Visit (HOSPITAL_COMMUNITY)
Admission: EM | Admit: 2016-09-20 | Discharge: 2016-09-20 | Disposition: A | Discharge status: Home / Self Care | Payer: Medicare Other | Attending: Emergency Medicine | Admitting: Emergency Medicine

## 2016-09-20 ENCOUNTER — Encounter (HOSPITAL_COMMUNITY): Payer: Self-pay | Admitting: Family Medicine

## 2016-09-20 DIAGNOSIS — S60562A Insect bite (nonvenomous) of left hand, initial encounter: Secondary | ICD-10-CM

## 2016-09-20 DIAGNOSIS — M79642 Pain in left hand: Secondary | ICD-10-CM | POA: Diagnosis not present

## 2016-09-20 DIAGNOSIS — W57XXXA Bitten or stung by nonvenomous insect and other nonvenomous arthropods, initial encounter: Secondary | ICD-10-CM | POA: Diagnosis not present

## 2016-09-20 MED ORDER — TRIAMCINOLONE ACETONIDE 0.1 % EX CREA: 1.0000 "application " | TOPICAL_CREAM | Freq: Two times a day (BID) | CUTANEOUS | 0 refills | Status: DC
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
## 2016-09-20 NOTE — ED Provider Notes (Signed)
  [facility]   [id number] 09/20/16 Arrival Time: 1429  ASSESSMENT & PLAN:  1. Insect bite, initial encounter     Meds ordered this encounter  Medications  . triamcinolone cream (KENALOG) 0.1 %    Sig: Apply 1 application topically 2 (two) times daily.    Dispense:  30 g    Refill:  0    Order Specific Question:   Supervising Provider    Answer:   Eustace MooreMURRAY, LAURA W [[id number]]    Reviewed expectations re: course of current medical issues. Questions answered. Outlined signs and symptoms indicating need for more acute intervention. Patient verbalized understanding. After Visit Summary given.   SUBJECTIVE:  Amanda Atkins is a 83 y.o. female who presents with complaint of Pain to the left hand. States she believes she had an insect bite last night, there is itching and pain last night, that has since improved. She has no fever, chills, no swelling in the hands, no red streaking noted. Daughter accompanies the patient, daughter is concerned that the mother may also have a bowel impaction. Mother was questioned about bowel patterns, she reports having 1 bowel movement today, one yesterday as well, she states she goes daily, denies any abdominal pain or distention, states she has not had to strain for defecation, no change in appetite or activity levels. Otherwise has no other complaints  ROS: As per HPI, otherwise negative.   OBJECTIVE:  Vitals:   09/20/16 1456  BP: 123/60  Pulse: 75  Resp: 18  Temp: 97.9 F (36.6 C)  TempSrc: Oral  SpO2: 98%     General appearance: alert; no distress, Appears stated age HEENT: normocephalic; atraumatic; conjunctivae normal;  Neck: supple Lungs: clear to auscultation bilaterally Heart: regular rate and rhythm Abdomen: soft, non-tender; bowel sounds normal; no masses or organomegaly; no guarding or rebound tenderness, no distention Back: no CVA tenderness Extremities: no cyanosis or edema; symmetrical with no gross  deformities Skin: warm and dry Neurologic: Grossly normal Psychological:  alert and cooperative; normal mood and affect   Labs Reviewed - No data to display  No results found.  Allergies  Allergen Reactions  . Gluten Meal Diarrhea    PMHx, SurgHx, SocialHx, Medications, and Allergies were reviewed in the Visit Navigator and updated as appropriate.      Dorena BodoKennard, Bryanda Mikel, NP 09/20/16 [phone]

## 2016-09-20 NOTE — Discharge Instructions (Signed)
Apply the cream twice daily to the affected area. If symptoms at anytime worsen him a return to clinic as needed.

## 2016-09-25 ENCOUNTER — Ambulatory Visit (INDEPENDENT_AMBULATORY_CARE_PROVIDER_SITE_OTHER): Payer: Medicare Other

## 2016-09-25 ENCOUNTER — Ambulatory Visit (HOSPITAL_COMMUNITY)
Admission: EM | Admit: 2016-09-25 | Discharge: 2016-09-25 | Disposition: A | Discharge status: Home / Self Care | Payer: Medicare Other | Attending: Emergency Medicine | Admitting: Emergency Medicine

## 2016-09-25 DIAGNOSIS — W19XXXD Unspecified fall, subsequent encounter: Secondary | ICD-10-CM

## 2016-09-25 DIAGNOSIS — S51011A Laceration without foreign body of right elbow, initial encounter: Secondary | ICD-10-CM

## 2016-09-25 DIAGNOSIS — M25521 Pain in right elbow: Secondary | ICD-10-CM | POA: Diagnosis not present

## 2016-09-25 DIAGNOSIS — R42 Dizziness and giddiness: Secondary | ICD-10-CM

## 2016-09-25 DIAGNOSIS — I4891 Unspecified atrial fibrillation: Secondary | ICD-10-CM | POA: Diagnosis not present

## 2016-09-25 LAB — POCT I-STAT, CHEM 8
BUN: 15 mg/dL (ref 6–20)
CREATININE: 0.8 mg/dL (ref 0.44–1.00)
Calcium, Ion: 1.19 mmol/L (ref 1.15–1.40)
Chloride: 99 mmol/L — ABNORMAL LOW (ref 101–111)
Glucose, Bld: 148 mg/dL — ABNORMAL HIGH (ref 65–99)
HEMATOCRIT: 38 % (ref 36.0–46.0)
HEMOGLOBIN: 12.9 g/dL (ref 12.0–15.0)
POTASSIUM: 4.5 mmol/L (ref 3.5–5.1)
SODIUM: 139 mmol/L (ref 135–145)
TCO2: 30 mmol/L (ref 0–100)

## 2016-09-25 NOTE — ED Triage Notes (Addendum)
Patient states that she was leaving the beauty pallor and she became dizzy stating she fell to the ground. Patient with abrasion to right arm. Patient states that she did not pass out. No blood thinners. No headache. No blurred vision. Patient reports mild dizziness at this time. Patient is poor historian.

## 2016-09-25 NOTE — Discharge Instructions (Signed)
Keep your wound clean and dry, change her bandages at least once a day, he can apply an over-the-counter antibiotic such as Neosporin or bacitracin. Drink plenty of fluid, and schedule follow-up care with her primary care provider as needed, particularly if your dizziness persists

## 2016-09-25 NOTE — ED Provider Notes (Signed)
[facility]   [id number] 09/25/16 Arrival Time: 1522  ASSESSMENT & PLAN:  1. Skin tear of right elbow without complication, initial encounter   2. Dizziness   3. Atrial fibrillation, unspecified type (HCC)     Negative orthostatics, atrial fibrillation found on EKG, she does have a past history of this condition, she is neurologically intact, will refer back to primary care for further evaluation of this condition. Otherwise rest, plenty of fluids, counseling on wound care provided  Reviewed expectations re: course of current medical issues. Questions answered. Outlined signs and symptoms indicating need for more acute intervention. Patient verbalized understanding. After Visit Summary given.   SUBJECTIVE:  Amanda Atkins is a 83 y.o. female who presents with complaint of right elbow pain and skin tear secondary to a fall that occurred earlier today. States she was walking to her car when she got "light headed" and dizzy and fell.  She denies loss of consciousness, remembers all the events around the fall, and is not taking blood thinners.  She has a past hx of atrial fibrillation, and is not on any medicines for rhythm control. She reports eating and drinking normally, last void 2 hours prior to assessment.   ROS: As per HPI, remainder of ROS negative.   OBJECTIVE:  Vitals:   09/25/16 1601  BP: (!) 170/88  Pulse: (!) 105  Resp: 18  Temp: 98 F (36.7 C)  TempSrc: Oral  SpO2: 100%     General appearance: alert; no distress HEENT: normocephalic; atraumatic; conjunctivae normal;  Neck: no step offs or deformities, no pain on ROM. Lungs: clear to auscultation bilaterally Heart: regular rate and rhythm irregularly irregular Abdomen: soft, non-tender; bowel sounds normal; no masses or organomegaly; no guarding or rebound tenderness Back: no CVA tenderness, no point tenderness along the spine Extremities: no cyanosis or edema; symmetrical with no gross  deformities Skin: warm and dry, skin tear to the right posterior elbow over the olecranon process Neurologic: Cranial Nerves:  II: peripheral fields grossly intact III,IV, VI: ptosis not present, extra-ocular movements intact bilaterally, direct and consensual pupillary light reflexes intact bilaterally V: facial sensation, jaw opening, and bite strength equal bilaterally VII: eyebrow raise, eyelid close, smile, frown, pucker equal bilaterally VIII: hearing grossly normal bilaterally  IX,X: palate elevation and swallowing intact XI: bilateral shoulder shrug and lateral head rotation equal and strong XII: midline tongue extension General neurologic exam: stroke screen negative, gait fluid and unremarkable Psychological:  alert and cooperative; normal mood and affect  Procedures:     Results for orders placed or performed during the hospital encounter of 09/25/16  I-STAT, chem 8  Result Value Ref Range   Sodium 139 135 - 145 mmol/L   Potassium 4.5 3.5 - 5.1 mmol/L   Chloride 99 (L) 101 - 111 mmol/L   BUN 15 6 - 20 mg/dL   Creatinine, Ser 4.09 0.44 - 1.00 mg/dL   Glucose, Bld 811 (H) 65 - 99 mg/dL   Calcium, Ion 9.14 7.82 - 1.40 mmol/L   TCO2 30 0 - 100 mmol/L   Hemoglobin 12.9 12.0 - 15.0 g/dL   HCT 95.6 21.3 - 08.6 %    Labs Reviewed  POCT I-STAT, CHEM 8 - Abnormal; Notable for the following:       Result Value   Chloride 99 (*)    Glucose, Bld 148 (*)    All other components within normal limits    Dg Elbow Complete Right  Result Date: 09/25/2016 CLINICAL DATA:  Fall today.  Right elbow injury. EXAM: RIGHT ELBOW - COMPLETE 3+ VIEW COMPARISON:  None. FINDINGS: Soft tissue laceration posterior to the right olecranon. No fracture, joint effusion, dislocation or suspicious focal osseous lesion. 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 Small enthesophyte in the lateral epicondyles of the right distal humerus. No radiopaque foreign body.  IMPRESSION: Soft tissue laceration posterior to the right olecranon. No right elbow fracture, joint effusion or malalignment. No radiopaque foreign body. Electronically Signed   By: Delbert PhenixJason A Poff M.D.   On: 09/25/2016 16:29    Allergies  Allergen Reactions  . Gluten Meal Diarrhea    PMHx, SurgHx, SocialHx, Medications, and Allergies were reviewed in the Visit Navigator and updated as appropriate.       Dorena BodoKennard, Rudene Poulsen, NP 09/26/16 1143

## 2016-10-06 ENCOUNTER — Ambulatory Visit (INDEPENDENT_AMBULATORY_CARE_PROVIDER_SITE_OTHER): Payer: Medicare Other | Admitting: Internal Medicine

## 2016-10-06 ENCOUNTER — Encounter: Payer: Self-pay | Admitting: Internal Medicine

## 2016-10-06 VITALS — BP 150/80 | HR 92 | Temp 99.1°F | Wt 108.0 lb

## 2016-10-06 DIAGNOSIS — H6692 Otitis media, unspecified, left ear: Secondary | ICD-10-CM

## 2016-10-06 DIAGNOSIS — I480 Paroxysmal atrial fibrillation: Secondary | ICD-10-CM

## 2016-10-06 MED ORDER — AMOXICILLIN 250 MG PO CAPS: 250.0000 mg | ORAL_CAPSULE | Freq: Three times a day (TID) | ORAL | 0 refills | Status: DC

## 2016-10-06 NOTE — Progress Notes (Signed)
   Subjective:    Patient ID: Amanda Atkins, female    DOB: Jun 30, 1924, 83 y.o.   MRN: [id number]  HPI 83 year old female in today with complaint of left ear pain for 2 days. She continues to drive and resides alone.  Denies cough sore throat or congestion. Right ear feels okay. No fever.    Review of Systems see above     Objective:   Physical Exam Pharynx is clear. Right TM is clear. Left TM is dull and full but not red neck supple. Chest clear.       Assessment & Plan:  Acute left otitis media  Plan: Amoxicillin 250 mg 3 times daily for 7 days.

## 2016-10-06 NOTE — Patient Instructions (Signed)
Amoxicillin 250 mg 3 times daily for 7 days

## 2016-12-30 ENCOUNTER — Telehealth: Payer: Self-pay

## 2016-12-30 NOTE — Telephone Encounter (Signed)
Per [facility] called pt to make yearly CPE, had to LVM for pt to return call to schedule 

## 2017-05-31 ENCOUNTER — Ambulatory Visit (HOSPITAL_COMMUNITY)
Admission: EM | Admit: 2017-05-31 | Discharge: 2017-05-31 | Disposition: A | Discharge status: Home / Self Care | Payer: Medicare Other | Attending: Family Medicine | Admitting: Family Medicine

## 2017-05-31 ENCOUNTER — Encounter (HOSPITAL_COMMUNITY): Payer: Self-pay | Admitting: Family Medicine

## 2017-05-31 DIAGNOSIS — H9201 Otalgia, right ear: Secondary | ICD-10-CM

## 2017-05-31 MED ORDER — AZITHROMYCIN 250 MG PO TABS: ORAL_TABLET | ORAL | 0 refills | Status: DC

## 2017-05-31 NOTE — ED Triage Notes (Signed)
Pt here for right ear pain x 3 or 4 days and right great toe pain that has been chronic and recurrent. Denies injury. Hearing has gotten worse
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
## 2017-06-24 ENCOUNTER — Telehealth: Payer: Self-pay | Admitting: Internal Medicine

## 2017-06-24 NOTE — Telephone Encounter (Signed)
Called back and spoke with Bjorn Loser and advised to take to ENT, she stated that Dayanara had never been to one and she would just take her back to [facility] ED.

## 2017-06-24 NOTE — Telephone Encounter (Signed)
Amanda Atkins Daughter [phone]  Amanda Atkins called to say that her mom [name] is having ear pain and trouble hearing again, plus mom also says that her ears are itching. Amanda Atkins thinks that mom has broken Qtips off in her ears again, she would really like for her to come and see you instead of going to ED. Because [name] has trouble understanding, with her dementia.

## 2017-06-24 NOTE — Telephone Encounter (Signed)
Needs  to see ENT.

## 2017-07-01 ENCOUNTER — Ambulatory Visit: Payer: Medicare Other | Admitting: Internal Medicine

## 2017-07-01 ENCOUNTER — Encounter: Payer: Self-pay | Admitting: Internal Medicine

## 2017-07-01 ENCOUNTER — Telehealth: Payer: Self-pay

## 2017-07-01 ENCOUNTER — Ambulatory Visit
Admission: RE | Admit: 2017-07-01 | Discharge: 2017-07-01 | Disposition: A | Discharge status: Home / Self Care | Payer: Medicare Other | Source: Ambulatory Visit | Attending: Internal Medicine | Admitting: Internal Medicine

## 2017-07-01 VITALS — BP 130/88 | HR 99 | Temp 98.2°F | Wt 110.0 lb

## 2017-07-01 DIAGNOSIS — M79601 Pain in right arm: Secondary | ICD-10-CM

## 2017-07-01 DIAGNOSIS — L219 Seborrheic dermatitis, unspecified: Secondary | ICD-10-CM

## 2017-07-01 MED ORDER — PREDNISONE 10 MG PO TABS: 10.0000 mg | ORAL_TABLET | Freq: Every day | ORAL | 0 refills | Status: DC

## 2017-07-01 MED ORDER — FLUOCINOLONE ACETONIDE 0.01 % EX SOLN: CUTANEOUS | 0 refills | Status: DC

## 2017-07-01 NOTE — Telephone Encounter (Signed)
Bjorn Loser patient's daughter was notified of  Arm and shoulder xray results.

## 2017-07-06 ENCOUNTER — Telehealth: Payer: Self-pay | Admitting: Internal Medicine

## 2017-07-06 ENCOUNTER — Telehealth: Payer: Self-pay

## 2017-07-06 DIAGNOSIS — M79601 Pain in right arm: Secondary | ICD-10-CM

## 2017-07-06 NOTE — Telephone Encounter (Signed)
Amanda Atkins patient's daughter called states her mother was here last week with right arm pain, patient complains of pain and not better at all. Daughter isn't sure if mom completed prednisone due to mom's dementia. She would like to know if we can refer her to Dr. Shelle Iron in [place] ortho?

## 2017-07-06 NOTE — Telephone Encounter (Signed)
LVM to CB and cancel appointment for 07/13/17 :45 since she was in last week.

## 2017-07-06 NOTE — Telephone Encounter (Signed)
Send records to Dr. Paula Libra and fill out referral form [facility]. They will contact her directly. Give daughter's name for contact

## 2017-07-06 NOTE — Telephone Encounter (Signed)
Referral has been sent.

## 2017-07-07 ENCOUNTER — Encounter (HOSPITAL_COMMUNITY): Payer: Self-pay

## 2017-07-07 ENCOUNTER — Emergency Department (HOSPITAL_COMMUNITY)
Admission: EM | Admit: 2017-07-07 | Discharge: 2017-07-07 | Disposition: A | Discharge status: Home / Self Care | Payer: Medicare Other | Attending: Emergency Medicine | Admitting: Emergency Medicine

## 2017-07-07 ENCOUNTER — Other Ambulatory Visit: Payer: Self-pay

## 2017-07-07 ENCOUNTER — Encounter: Payer: Self-pay | Admitting: Internal Medicine

## 2017-07-07 DIAGNOSIS — Z79899 Other long term (current) drug therapy: Secondary | ICD-10-CM | POA: Diagnosis not present

## 2017-07-07 DIAGNOSIS — M25512 Pain in left shoulder: Secondary | ICD-10-CM | POA: Insufficient documentation

## 2017-07-07 DIAGNOSIS — Z87891 Personal history of nicotine dependence: Secondary | ICD-10-CM | POA: Insufficient documentation

## 2017-07-07 NOTE — ED Triage Notes (Signed)
Pt endorses right arm pain from the right wrist up to the right shoulder, all worse with movement. No cardiac sx. PMS intact, VSS.

## 2017-07-07 NOTE — Progress Notes (Signed)
   Subjective:    Patient ID: Amanda Atkins, female    DOB: [birth year], 84 y.o.   MRN: [id number]  HPI 84 year old Female with history of memory loss accompanied by her daughter in today with complaint of pain in right upper arm and shoulder.  Has been going on for some time.  No history of fall that she her daughter is aware of.  Patient does not like to take medication.  Uses a lot of herbal supplements.    Review of Systems complaining of itching in ears.  Uses Q-tips in her ears and has had one or two break off into ear canal.     Objective:   Physical Exam TMs are clear but canals are slightly erythematous.  She has slight decreased range of motion in the right upper extremity.  Tender when trying to raise right arm up over her head.  Some point tenderness in her mid humerus.       Assessment & Plan:  She does not tolerate medications very well.  X-rays of right forearm and shoulder are normal.  We are going to give her a short course of prednisone and see if symptoms improve.  Prescribed prednisone 10 mg daily for 7 days.  Musculoskeletal pain right upper extremity and shoulder  Seborrhea of the ears  For seborrhea of the ears she was prescribed fluocinolone to use once or twice weekly in ear canals

## 2017-07-07 NOTE — Patient Instructions (Signed)
For ear seborrhea, fluocinolone apply to ears once or twice weekly for itching.  Short course of prednisone take as directed for musculoskeletal pain in arm and shoulder.  See orthopedist if shoulder pain does not improve.

## 2017-07-07 NOTE — ED Notes (Signed)
Pt verbalizes understanding of d/c instructions. Pt ambulatory at d/c with all belongings and with family.   

## 2017-07-07 NOTE — Telephone Encounter (Signed)
Amanda Atkins patient's daughter called stating that patient was not coming in next week for her follow up appt bc she was going to take her to the ED to evaluate her arm "for a fracture". Advised that xrays from last week were negative, she insisted that she was going to take her to the ED.

## 2017-07-07 NOTE — Discharge Instructions (Addendum)
Please read attached information. If you experience any new or worsening signs or symptoms please return to the emergency room for evaluation. Please follow-up with your primary care provider or specialist as discussed. Please use medication prescribed only as directed and discontinue taking if you have any concerning signs or symptoms.   °
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
## 2017-07-08 NOTE — Telephone Encounter (Signed)
Received fax of an appointment with Dr Shelle Iron on 07/09/17 :00pm

## 2017-07-13 ENCOUNTER — Ambulatory Visit: Payer: Medicare Other | Admitting: Internal Medicine

## 2017-09-03 ENCOUNTER — Other Ambulatory Visit: Payer: Self-pay | Admitting: Internal Medicine

## 2017-09-03 DIAGNOSIS — Z0000 Encounter for general adult medical examination without abnormal findings: Secondary | ICD-10-CM

## 2017-09-06 ENCOUNTER — Other Ambulatory Visit: Payer: Medicare Other | Admitting: Internal Medicine

## 2017-09-06 ENCOUNTER — Encounter: Payer: Self-pay | Admitting: Internal Medicine

## 2017-09-06 ENCOUNTER — Ambulatory Visit (INDEPENDENT_AMBULATORY_CARE_PROVIDER_SITE_OTHER): Payer: Medicare Other | Admitting: Internal Medicine

## 2017-09-06 VITALS — BP 120/78 | HR 72 | Temp 98.1°F | Ht 59.0 in | Wt 109.0 lb

## 2017-09-06 DIAGNOSIS — I480 Paroxysmal atrial fibrillation: Secondary | ICD-10-CM | POA: Diagnosis not present

## 2017-09-06 DIAGNOSIS — R413 Other amnesia: Secondary | ICD-10-CM | POA: Diagnosis not present

## 2017-09-06 DIAGNOSIS — Z0000 Encounter for general adult medical examination without abnormal findings: Secondary | ICD-10-CM

## 2017-09-06 LAB — CBC WITH DIFFERENTIAL/PLATELET
Basophils Absolute: 68 cells/uL (ref 0–200)
Basophils Relative: 1.3 %
Eosinophils Absolute: 109 cells/uL (ref 15–500)
Eosinophils Relative: 2.1 %
HCT: 40.8 % (ref 35.0–45.0)
HEMOGLOBIN: 13.8 g/dL (ref 11.7–15.5)
LYMPHS ABS: 926 {cells}/uL (ref 850–3900)
MCH: 29.6 pg (ref 27.0–33.0)
MCHC: 33.8 g/dL (ref 32.0–36.0)
MCV: 87.6 fL (ref 80.0–100.0)
MPV: 10.5 fL (ref 7.5–12.5)
Monocytes Relative: 12.8 %
NEUTROS ABS: 3432 {cells}/uL (ref 1500–7800)
NEUTROS PCT: 66 %
Platelets: 211 10*3/uL (ref 140–400)
RBC: 4.66 10*6/uL (ref 3.80–5.10)
RDW: 13.6 % (ref 11.0–15.0)
Total Lymphocyte: 17.8 %
WBC mixed population: 666 cells/uL (ref 200–950)
WBC: 5.2 10*3/uL (ref 3.8–10.8)

## 2017-09-06 LAB — POCT URINALYSIS DIPSTICK
APPEARANCE: NORMAL
Bilirubin, UA: NEGATIVE
Blood, UA: NEGATIVE
Glucose, UA: NEGATIVE
KETONES UA: NEGATIVE
Leukocytes, UA: NEGATIVE
NITRITE UA: NEGATIVE
ODOR: NORMAL
PROTEIN UA: NEGATIVE
Spec Grav, UA: 1.01 (ref 1.010–1.025)
Urobilinogen, UA: 0.2 E.U./dL
pH, UA: 6.5 (ref 5.0–8.0)

## 2017-09-06 LAB — COMPLETE METABOLIC PANEL WITH GFR
AG Ratio: 1.7 (calc) (ref 1.0–2.5)
ALKALINE PHOSPHATASE (APISO): 59 U/L (ref 33–130)
ALT: 14 U/L (ref 6–29)
AST: 18 U/L (ref 10–35)
Albumin: 4.2 g/dL (ref 3.6–5.1)
BILIRUBIN TOTAL: 0.7 mg/dL (ref 0.2–1.2)
BUN: 16 mg/dL (ref 7–25)
CHLORIDE: 104 mmol/L (ref 98–110)
CO2: 29 mmol/L (ref 20–32)
Calcium: 9.7 mg/dL (ref 8.6–10.4)
Creat: 0.87 mg/dL (ref 0.60–0.88)
GFR, Est African American: 67 mL/min/{1.73_m2} (ref >=60)
GFR, Est Non African American: 58 mL/min/{1.73_m2} — ABNORMAL LOW (ref >=60)
GLUCOSE: 92 mg/dL (ref 65–99)
Globulin: 2.5 g/dL (calc) (ref 1.9–3.7)
Potassium: 4.4 mmol/L (ref 3.5–5.3)
Sodium: 141 mmol/L (ref 135–146)
Total Protein: 6.7 g/dL (ref 6.1–8.1)

## 2017-09-06 LAB — LIPID PANEL
CHOL/HDL RATIO: 3.2 (calc) (ref <5.0)
Cholesterol: 230 mg/dL — ABNORMAL HIGH (ref <200)
HDL: 71 mg/dL (ref >50)
LDL Cholesterol (Calc): 140 mg/dL (calc) — ABNORMAL HIGH
NON-HDL CHOLESTEROL (CALC): 159 mg/dL — AB (ref <130)
TRIGLYCERIDES: 86 mg/dL (ref <150)

## 2017-09-06 LAB — TSH: TSH: 0.7 mIU/L (ref 0.40–4.50)

## 2017-09-15 DIAGNOSIS — R413 Other amnesia: Secondary | ICD-10-CM | POA: Insufficient documentation
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
## 2017-09-15 NOTE — Patient Instructions (Signed)
It was a pleasure to see you today.  Please accept some help in the home.  Continue same medications and return in 1 year or as needed.  Be careful about falling.

## 2017-11-18 ENCOUNTER — Telehealth: Payer: Self-pay | Admitting: Internal Medicine

## 2017-11-18 DIAGNOSIS — F0281 Dementia in other diseases classified elsewhere with behavioral disturbance: Secondary | ICD-10-CM

## 2017-11-18 DIAGNOSIS — R451 Restlessness and agitation: Secondary | ICD-10-CM

## 2017-11-18 MED ORDER — OLANZAPINE 5 MG PO TABS: 5.0000 mg | ORAL_TABLET | Freq: Every day | ORAL | 0 refills | Status: DC

## 2017-11-18 NOTE — Telephone Encounter (Signed)
Amanda Atkins (daughter) is calling and wants to know if there is anything that the patient can take in the afternoon to help to keep her calmed down a little?  States that she gets angry and agitated.  She is anxious and changes clothes and won't sit down and during all of this is when she tends to get angry.    The daughter is concerned that when winter gets here, things will really be bad for her.  When the sun goes down earlier in the day, etc.    [facility] [place]  Phone:  [phone]

## 2017-11-18 NOTE — Telephone Encounter (Signed)
E-scribed left message letting Amanda Atkins know.

## 2017-11-18 NOTE — Telephone Encounter (Signed)
Call in Zyprexa 5 mg  #30 one po daily with no refill

## 2017-12-03 ENCOUNTER — Ambulatory Visit (INDEPENDENT_AMBULATORY_CARE_PROVIDER_SITE_OTHER): Payer: Medicare Other | Admitting: Internal Medicine

## 2017-12-03 ENCOUNTER — Encounter: Payer: Self-pay | Admitting: Internal Medicine

## 2017-12-03 ENCOUNTER — Telehealth: Payer: Self-pay | Admitting: Internal Medicine

## 2017-12-03 VITALS — Ht 59.0 in | Wt 115.0 lb

## 2017-12-03 DIAGNOSIS — R8290 Unspecified abnormal findings in urine: Secondary | ICD-10-CM | POA: Diagnosis not present

## 2017-12-03 DIAGNOSIS — R32 Unspecified urinary incontinence: Secondary | ICD-10-CM

## 2017-12-03 DIAGNOSIS — Y92009 Unspecified place in unspecified non-institutional (private) residence as the place of occurrence of the external cause: Secondary | ICD-10-CM | POA: Diagnosis not present

## 2017-12-03 DIAGNOSIS — R319 Hematuria, unspecified: Secondary | ICD-10-CM | POA: Diagnosis not present

## 2017-12-03 DIAGNOSIS — W19XXXA Unspecified fall, initial encounter: Secondary | ICD-10-CM

## 2017-12-03 MED ORDER — OLANZAPINE 5 MG PO TABS: 5.0000 mg | ORAL_TABLET | Freq: Every day | ORAL | 3 refills | Status: DC

## 2017-12-03 MED ORDER — NITROFURANTOIN MONOHYD MACRO 100 MG PO CAPS: 100.0000 mg | ORAL_CAPSULE | Freq: Two times a day (BID) | ORAL | 0 refills | Status: DC

## 2017-12-03 NOTE — Telephone Encounter (Signed)
Spoke with daughter- pt did not strike head. Continues to urinate. Wants her checked for UTI. She will bring her in for that this am.

## 2017-12-03 NOTE — Telephone Encounter (Signed)
Patient was seen and treated

## 2017-12-03 NOTE — Patient Instructions (Addendum)
Urine culture pending. Zyprexa refilled.  Take Macrobid for 3 days.  Declines flu vaccine

## 2017-12-03 NOTE — Progress Notes (Signed)
   Subjective:    Patient ID: Amanda Atkins, female    DOB: [birth year], 84 y.o.   MRN: [id number]  HPI  Has gained 6 pounds since July.  She is on Zyprexa now and is not as agitated as she was previously according to her daughter, Bjorn Loser, who accompanies her today.  Apparently patient suffered a fall at home in the bathroom.  Apparently slipped on some urine.  She did not strike her head according to her daughter.  She is not complaining of any musculoskeletal pain.  She is alert.  However, daughter is concerned she may have a urinary tract infection but because been urinating frequently this morning.      Review of Systems see above-history of dementia     Objective:   Physical Exam She is alert but not oriented to month or year.  She knows her daughter.  She confabulates sometimes.  She does remember falling this morning in the bathroom and she does recognize that she is urinating a lot.  She denies dysuria.  No CVA tenderness.       Assessment & Plan:  Urinary frequency-culture sent  Memory loss-has caretakers at home.  Zyprexa has helped agitation.  No longer asking to drive car according to her daughter.  Plan: Zyprexa refilled at daughter's request.  Macrobid 100 mg twice daily for 3 days pending culture results.  25 minutes spent with patient and her daughter

## 2017-12-03 NOTE — Telephone Encounter (Signed)
Judie Grieve, patients daughter called stating that her Mother took a bad fall this morning and apparently passed out from the fall.  She started urinating in the floor.  [name] called 911.  When EMS got there, they were able to get her aroused and they checked her out and EMS felt the patient most likely had a UTI.  She continued to urinate in the floor.  So, while EMS was still at the house, Bjorn Loser called here and said she was going to have to bring the patient here to be seen.  I advised that we did not have any appointments and she would need to take her Mom to the ED to be evaluated since this was a fall and EMS was called.  She shouted "NO!  This is an emergency!  I need to bring her to your office for Dr. Lenord Fellers to see her!  EMS said she is fine and she has a UTI!"  I said "Bjorn Loser, this IS an emergency, you are correct!  And, that is why you will need to take her to the ED.  We are NOT an urgent care and we do not have any appointments to see your Mom this morning, I am sorry."  She shouted again, Oh My God!! And hung up the phone!!    The call could be heard by Araceli at her desk.  Just an FYI for you!

## 2017-12-05 LAB — URINE CULTURE
MICRO NUMBER: 91255343
Result:: NO GROWTH
SPECIMEN QUALITY: ADEQUATE

## 2017-12-06 LAB — POCT URINALYSIS DIPSTICK
Appearance: NEGATIVE
Bilirubin, UA: NEGATIVE
Blood, UA: NEGATIVE
GLUCOSE UA: NEGATIVE
Ketones, UA: NEGATIVE
NITRITE UA: NEGATIVE
ODOR: NEGATIVE
PROTEIN UA: NEGATIVE
Spec Grav, UA: 1.01 (ref 1.010–1.025)
Urobilinogen, UA: 0.2 E.U./dL
pH, UA: 6.5 (ref 5.0–8.0)

## 2017-12-07 ENCOUNTER — Telehealth: Payer: Self-pay | Admitting: Internal Medicine

## 2017-12-07 NOTE — Telephone Encounter (Signed)
Lower back and tail bone pain.  Back apparently started hurting later in the day on the day of patients fall and was hurting the worst at bedtime and 2-3 days after the fall.    Patient will come in on Wed. 10/23 for appointment at 10:30.

## 2017-12-07 NOTE — Telephone Encounter (Signed)
Daughter calling; states that her Mother is just crying out in pain with her back (this is from her fall last week).  Wants to know if you would put in for an x-ray of her back to see if there's any fractures in her back that would be causing so much pain.  And, also wants to know if you would prescribe any pain medication.    Explained that you normally don't like to do a lot of pain medication for the older patients because it puts them at a higher risk for falls when they are medicated with pain medication.  She says that Tylenol nor Advil are helping with the pain.    Advised let's see if we can get the x-ray and see what's going on there first and then we would see what needs to be addressed.    Thank you.

## 2017-12-08 ENCOUNTER — Ambulatory Visit
Admission: RE | Admit: 2017-12-08 | Discharge: 2017-12-08 | Disposition: A | Discharge status: Home / Self Care | Payer: Medicare Other | Source: Ambulatory Visit | Attending: Internal Medicine | Admitting: Internal Medicine

## 2017-12-08 ENCOUNTER — Ambulatory Visit (INDEPENDENT_AMBULATORY_CARE_PROVIDER_SITE_OTHER): Payer: Medicare Other | Admitting: Internal Medicine

## 2017-12-08 DIAGNOSIS — M546 Pain in thoracic spine: Secondary | ICD-10-CM | POA: Diagnosis not present

## 2017-12-08 DIAGNOSIS — W19XXXA Unspecified fall, initial encounter: Secondary | ICD-10-CM

## 2017-12-08 DIAGNOSIS — R0781 Pleurodynia: Secondary | ICD-10-CM

## 2017-12-08 DIAGNOSIS — M545 Low back pain, unspecified: Secondary | ICD-10-CM

## 2017-12-08 MED ORDER — HYDROCODONE-ACETAMINOPHEN 5-325 MG PO TABS: 1.0000 | ORAL_TABLET | Freq: Four times a day (QID) | ORAL | 0 refills | Status: DC | PRN

## 2017-12-08 NOTE — Telephone Encounter (Signed)
Seeing today 

## 2017-12-10 ENCOUNTER — Telehealth: Payer: Self-pay | Admitting: Internal Medicine

## 2017-12-10 NOTE — Telephone Encounter (Signed)
Daughter is calling stating that her Mom is having to take pain pills about every 6 hours.  She is giving her 3 a day and rotating Motrin in between.  She says that about 3pm and through the night is the worst time for her.  Says that she is almost screaming in pain in the late evening and during the night.  She tries to wake her up about 4 in the morning to keep some pain medication in her so the pain will not get so bad.  However, when she wakes her up to give her the pain medicines, she most of the time stays awake and will get up and start to change clothes and start eating, etc.  Should she just allow her to sleep as long as she will and not wake her until she wakes up??    She just wanted you to know that she's trying very hard to use it sparingly as you suggested, but she's having such terrible pain.

## 2017-12-12 ENCOUNTER — Encounter: Payer: Self-pay | Admitting: Internal Medicine

## 2017-12-12 MED ORDER — OLANZAPINE 10 MG PO TABS: 10.0000 mg | ORAL_TABLET | Freq: Every day | ORAL | 0 refills | Status: DC

## 2017-12-12 NOTE — Telephone Encounter (Signed)
This sounds like sundowning. I would increase the Zyprexa to 10 mg a day at bedtime  To see if she will sleep through the night.

## 2017-12-12 NOTE — Patient Instructions (Signed)
May need to have CT scan of T-spine  to determine acuity of compression fractures which are new since 2009.  No evidence of rib fractures.  Take hydrocodone APAP sparingly every 6 hours.  Continue Zyprexa.

## 2017-12-12 NOTE — Progress Notes (Signed)
   Subjective:    Patient ID: Amanda Atkins, female    DOB: Sep 03, 1924, 84 y.o.   MRN: [id number]  HPI She was seen October 18 after a fall at home in [facility] with urine on the floor. Looked fine at the time and no Xrays were done. History of dementia.  Agitation treated with Zyprexa 5 mg hs. Now apparently complaining of more pain and daughter is worried about a compression fracture.    Review of Systems     Objective:   Physical Exam Tender over ribs bilaterally. Alert but not oriented to place or time. Tender over thoracic and lumbar spine areas diffusely. Xrays show old compression fracture of L1 and compression fractures T3 and T8 new since 2009. CT scan could help determine if these are acute or not per Radiologist.  Daughter is asking for more pain med for pt. Explained new opioid regulations regarding acute pain.       Assessment & Plan:  Encounter regarding fall-subsequent encounter  Acute midline thoracic back pain  Low back pain lumbar area  Bilateral rib cage pain  Memory loss  Agitation  Plan: Continue Zyprexa 5 mg at bedtime.  Norco 5/325 sparingly #20 given for 5-day supply every 6 hours as needed pain.  May need physical therapy.  May need CT scan to determine if these are acute compression fractures of the spine.  No evidence of rib fractures.  Might need to consider short-term rehab admission if unable to manage at home.  25 minutes spent with patient and her daughter

## 2017-12-13 ENCOUNTER — Telehealth: Payer: Self-pay | Admitting: Internal Medicine

## 2017-12-13 NOTE — Telephone Encounter (Signed)
Carollee Herter, can you please refill this on Tuesday please per Dr. Beryle Quant note?  Thank you.

## 2017-12-13 NOTE — Telephone Encounter (Signed)
Spoke with daughter to let her know about the increase in the medication and what the benefits of the medication are.  Amanda Atkins verbalized understanding of our conversation.  Advised we'll send Zyprexa medication in on Tuesday to the pharmacy.    Amanda Atkins, will you please e-scribe for me please per Dr. Beryle Quant orders?   Thank you.

## 2017-12-13 NOTE — Telephone Encounter (Signed)
Bjorn Loser is calling to request a refill on Norco for her Mother.  Says that she's doing a little better with lying in the bed, however, she's needing more pain pills.  Wants to know if you will refill it?  [facility] [place]  Thank you.

## 2017-12-13 NOTE — Telephone Encounter (Signed)
Cannot refill until tomorrow and can only do it this time only for acute pain. Have her call back tomorrow.

## 2017-12-14 ENCOUNTER — Telehealth: Payer: Self-pay | Admitting: Internal Medicine

## 2017-12-14 MED ORDER — HYDROCODONE-ACETAMINOPHEN 5-325 MG PO TABS: 1.0000 | ORAL_TABLET | Freq: Four times a day (QID) | ORAL | 0 refills | Status: DC | PRN

## 2017-12-14 NOTE — Telephone Encounter (Signed)
Zyprexa 10mg  was escribed 12/12/2017 by Dr Lenord Fellers.

## 2017-12-14 NOTE — Telephone Encounter (Addendum)
Daughter wants to know if you will send the Norco 5-325?  She would like to send someone to pick it up tomorrow.  The pharmacy has the Zyprexa ready.    Thank you.    Ph[facility]acy:  [facility]  E-scribed #20 which is a refill of 5 day limit.

## 2017-12-14 NOTE — Telephone Encounter (Signed)
Zyprexa 10mg  was escribed 12/12/2017.

## 2017-12-17 ENCOUNTER — Telehealth: Payer: Self-pay | Admitting: Internal Medicine

## 2017-12-17 ENCOUNTER — Emergency Department (HOSPITAL_COMMUNITY): Payer: Medicare Other

## 2017-12-17 ENCOUNTER — Other Ambulatory Visit: Payer: Self-pay

## 2017-12-17 ENCOUNTER — Emergency Department (HOSPITAL_COMMUNITY)
Admission: EM | Admit: 2017-12-17 | Discharge: 2017-12-18 | Disposition: A | Discharge status: Home / Self Care | Payer: Medicare Other | Attending: Emergency Medicine | Admitting: Emergency Medicine

## 2017-12-17 ENCOUNTER — Encounter (HOSPITAL_COMMUNITY): Payer: Self-pay | Admitting: Obstetrics and Gynecology

## 2017-12-17 DIAGNOSIS — F0281 Dementia in other diseases classified elsewhere with behavioral disturbance: Secondary | ICD-10-CM | POA: Diagnosis not present

## 2017-12-17 DIAGNOSIS — I4891 Unspecified atrial fibrillation: Secondary | ICD-10-CM | POA: Diagnosis not present

## 2017-12-17 DIAGNOSIS — G309 Alzheimer's disease, unspecified: Secondary | ICD-10-CM | POA: Insufficient documentation

## 2017-12-17 DIAGNOSIS — F0391 Unspecified dementia with behavioral disturbance: Secondary | ICD-10-CM | POA: Diagnosis not present

## 2017-12-17 DIAGNOSIS — R4182 Altered mental status, unspecified: Secondary | ICD-10-CM | POA: Diagnosis present

## 2017-12-17 DIAGNOSIS — F03918 Unspecified dementia, unspecified severity, with other behavioral disturbance: Secondary | ICD-10-CM | POA: Diagnosis present

## 2017-12-17 HISTORY — DX: Unspecified dementia, unspecified severity, without behavioral disturbance, psychotic disturbance, mood disturbance, and anxiety: F03.90

## 2017-12-17 LAB — COMPREHENSIVE METABOLIC PANEL
ALK PHOS: 67 U/L (ref 38–126)
ALT: 33 U/L (ref 0–44)
ANION GAP: 6 (ref 5–15)
AST: 24 U/L (ref 15–41)
Albumin: 3.4 g/dL — ABNORMAL LOW (ref 3.5–5.0)
BILIRUBIN TOTAL: 0.6 mg/dL (ref 0.3–1.2)
BUN: 19 mg/dL (ref 8–23)
CALCIUM: 8.8 mg/dL — AB (ref 8.9–10.3)
CO2: 25 mmol/L (ref 22–32)
CREATININE: 1.04 mg/dL — AB (ref 0.44–1.00)
Chloride: 110 mmol/L (ref 98–111)
GFR, EST AFRICAN AMERICAN: 53 mL/min — AB (ref >60)
GFR, EST NON AFRICAN AMERICAN: 46 mL/min — AB (ref >60)
Glucose, Bld: 110 mg/dL — ABNORMAL HIGH (ref 70–99)
Potassium: 3.6 mmol/L (ref 3.5–5.1)
Sodium: 141 mmol/L (ref 135–145)
TOTAL PROTEIN: 6.3 g/dL — AB (ref 6.5–8.1)

## 2017-12-17 LAB — CBC WITH DIFFERENTIAL/PLATELET
Abs Immature Granulocytes: 0.03 10*3/uL (ref 0.00–0.07)
Basophils Absolute: 0.1 10*3/uL (ref 0.0–0.1)
Basophils Relative: 1 %
EOS ABS: 0.1 10*3/uL (ref 0.0–0.5)
EOS PCT: 1 %
HEMATOCRIT: 37.3 % (ref 36.0–46.0)
HEMOGLOBIN: 11.6 g/dL — AB (ref 12.0–15.0)
Immature Granulocytes: 0 %
Lymphocytes Relative: 13 %
Lymphs Abs: 0.9 10*3/uL (ref 0.7–4.0)
MCH: 28.9 pg (ref 26.0–34.0)
MCHC: 31.1 g/dL (ref 30.0–36.0)
MCV: 92.8 fL (ref 80.0–100.0)
MONO ABS: 0.9 10*3/uL (ref 0.1–1.0)
Monocytes Relative: 13 %
NEUTROS ABS: 5.3 10*3/uL (ref 1.7–7.7)
Neutrophils Relative %: 72 %
Platelets: 226 10*3/uL (ref 150–400)
RBC: 4.02 MIL/uL (ref 3.87–5.11)
RDW: 14.5 % (ref 11.5–15.5)
WBC: 7.3 10*3/uL (ref 4.0–10.5)
nRBC: 0 % (ref 0.0–0.2)

## 2017-12-17 LAB — URINALYSIS, ROUTINE W REFLEX MICROSCOPIC
BILIRUBIN URINE: NEGATIVE
Glucose, UA: NEGATIVE mg/dL
Hgb urine dipstick: NEGATIVE
KETONES UR: 5 mg/dL — AB
LEUKOCYTES UA: NEGATIVE
NITRITE: NEGATIVE
PROTEIN: NEGATIVE mg/dL
Specific Gravity, Urine: 1.026 (ref 1.005–1.030)
pH: 5 (ref 5.0–8.0)

## 2017-12-17 LAB — I-STAT TROPONIN, ED: Troponin i, poc: 0 ng/mL (ref 0.00–0.08)

## 2017-12-17 MED ORDER — ALUM & MAG HYDROXIDE-SIMETH 200-200-20 MG/5ML PO SUSP: 30.0000 mL | Freq: Four times a day (QID) | ORAL | Status: DC | PRN

## 2017-12-17 MED ORDER — IBUPROFEN 200 MG PO TABS: 400.0000 mg | ORAL_TABLET | Freq: Four times a day (QID) | ORAL | Status: DC | PRN

## 2017-12-17 MED ORDER — HYDROCODONE-ACETAMINOPHEN 5-325 MG PO TABS
1.0000 | ORAL_TABLET | ORAL | Status: AC | PRN
  Administered 2017-12-17: 1 via ORAL

## 2017-12-17 MED ORDER — LORAZEPAM 0.5 MG PO TABS
0.2500 mg | ORAL_TABLET | ORAL | Status: AC | PRN
  Administered 2017-12-17: 0.25 mg via ORAL
  Filled 2017-12-17: qty 1

## 2017-12-17 MED ORDER — OLANZAPINE 10 MG PO TABS
10.0000 mg | ORAL_TABLET | Freq: Every day | ORAL | Status: DC
  Administered 2017-12-17: 10 mg via ORAL
  Filled 2017-12-17: qty 1

## 2017-12-17 MED ORDER — HYDROCODONE-ACETAMINOPHEN 5-325 MG PO TABS
1.0000 | ORAL_TABLET | Freq: Four times a day (QID) | ORAL | Status: DC | PRN
  Filled 2017-12-17: qty 1
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
## 2017-12-17 NOTE — ED Notes (Signed)
XR at bedside
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
## 2017-12-17 NOTE — BHH Counselor (Addendum)
Please call pt's daughter Judie Grieve, [phone], if additional questions arise or any changes in the pt's treatment.   Redmond Pulling, MS, [facility], [facility] Triage Specialist [phone]
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
## 2017-12-17 NOTE — ED Notes (Signed)
Patient ambulatory with walker to restroom

## 2017-12-17 NOTE — ED Notes (Signed)
Family requesting XR of back. PA made aware.

## 2017-12-17 NOTE — Telephone Encounter (Signed)
Amanda Atkins, daughter back and advised to take patient to [facility] for psych evaluation.  Daughter wanted to know if they will keep her?  Advised that it really depends on what they determine from this evaluation.  She said surely they won't send her home.  I said, well it will be dependent upon what they determine.  And, you have to be a part of that conversation in terms of what you want for your Mom.  She said if they don't keep her, the next stop may have to be a nursing home.  She was very teary.  I told her if she needs anything to let us know.
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
## 2017-12-17 NOTE — ED Triage Notes (Signed)
Pt reports here from home with c/o back pain due to a prior compression fracture x2 weeks ago. Pt's family reports pt has become increasingly agitated and has made threats of hurting herself. Pt screams and cusses at care staff and no one can handle caring for her according to her daughter.

## 2017-12-17 NOTE — ED Notes (Signed)
Pt keeps removing leads during V/s

## 2017-12-17 NOTE — ED Notes (Signed)
Pt's jewelry was given to pt's daughter, Judie Grieve. She can be contacted at [phone].  Jewelry included one yellow ring with clear stone, one yellow ring with green stone, and one yellow ring with multiple clear stones.

## 2017-12-17 NOTE — ED Notes (Signed)
Patient ambulatory with assistance to restroom.

## 2017-12-17 NOTE — Telephone Encounter (Signed)
Daughter, Bjorn Loser calling; states that patient is tearing up the house.  She is tearing vents out of the floor, tearing the closets up.  Says she just has a wild look in her eyes.  Says she's giving her the Zyprexa at night and she seems ok with that, but she's still not sleeping through the night.  Says she'll get up to go to the bathroom, and she may sit up for an hour, eat some yogurt and then lay back down.    She's still just wild this morning and she doesn't know what to do to calm her down.  She's not there at the moment because she's working and the sitter is there.  The sitter called her to tell her about this.    You had mentioned about possibly trying Tramadol for the pain.  Is there anything else that we can give her to help during the day to calm her down during the day?    Thank you.    Phone:  [phone]

## 2017-12-18 DIAGNOSIS — F0391 Unspecified dementia with behavioral disturbance: Secondary | ICD-10-CM

## 2017-12-18 DIAGNOSIS — G309 Alzheimer's disease, unspecified: Secondary | ICD-10-CM

## 2017-12-18 DIAGNOSIS — F03918 Unspecified dementia, unspecified severity, with other behavioral disturbance: Secondary | ICD-10-CM | POA: Diagnosis present

## 2017-12-18 DIAGNOSIS — F0281 Dementia in other diseases classified elsewhere with behavioral disturbance: Secondary | ICD-10-CM

## 2017-12-18 MED ORDER — OLANZAPINE 10 MG PO TABS: 5.0000 mg | ORAL_TABLET | Freq: Every day | ORAL | 0 refills | Status: DC

## 2017-12-18 MED ORDER — DIPHENHYDRAMINE HCL 25 MG PO CAPS
25.0000 mg | ORAL_CAPSULE | Freq: Once | ORAL | Status: AC
  Administered 2017-12-18: 25 mg via ORAL
  Filled 2017-12-18: qty 1

## 2017-12-18 MED ORDER — LORAZEPAM 0.5 MG PO TABS
0.2500 mg | ORAL_TABLET | ORAL | Status: AC | PRN
  Administered 2017-12-18: 0.25 mg via ORAL
  Filled 2017-12-18: qty 1

## 2017-12-18 NOTE — ED Notes (Signed)
Attempted to obtain vital signs on pt however pt says no to placing pulse ox and bp was unable to be obtained due to pt moving a lot while bp cycled through.

## 2017-12-18 NOTE — ED Notes (Signed)
Report given to Dinkina RN, pt to go to Rm [facility] for evaluation
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
## 2017-12-18 NOTE — ED Notes (Signed)
Bed: WA26 Expected date:  Expected time:  Means of arrival:  Comments: Room 18 

## 2017-12-23 ENCOUNTER — Other Ambulatory Visit: Payer: Medicare Other

## 2017-12-23 ENCOUNTER — Other Ambulatory Visit: Payer: Self-pay | Admitting: Internal Medicine

## 2017-12-23 ENCOUNTER — Telehealth: Payer: Self-pay | Admitting: Internal Medicine

## 2017-12-23 DIAGNOSIS — W19XXXA Unspecified fall, initial encounter: Secondary | ICD-10-CM

## 2017-12-23 DIAGNOSIS — S060X0A Concussion without loss of consciousness, initial encounter: Secondary | ICD-10-CM

## 2017-12-23 DIAGNOSIS — R51 Headache: Secondary | ICD-10-CM

## 2017-12-23 DIAGNOSIS — G44309 Post-traumatic headache, unspecified, not intractable: Secondary | ICD-10-CM

## 2017-12-23 DIAGNOSIS — R519 Headache, unspecified: Secondary | ICD-10-CM

## 2017-12-23 DIAGNOSIS — S060X9A Concussion with loss of consciousness of unspecified duration, initial encounter: Secondary | ICD-10-CM

## 2017-12-23 NOTE — Addendum Note (Signed)
Addended by: Gregery Na on: 12/23/2017 11:04 AM   Modules accepted: Orders

## 2017-12-23 NOTE — Telephone Encounter (Signed)
Placed order for a STAT CT scan.

## 2017-12-23 NOTE — Telephone Encounter (Signed)
Spoke with Bjorn Loser I had made an appointment today with [facility] radiology and she said she can not take her today but radiologist said that she can walk in anytime and they can work her in [place] verbalized understanding and she said she will take patient tomorrow. No PA was required.
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
## 2017-12-24 ENCOUNTER — Ambulatory Visit
Admission: RE | Admit: 2017-12-24 | Discharge: 2017-12-24 | Disposition: A | Discharge status: Home / Self Care | Payer: Medicare Other | Source: Ambulatory Visit | Attending: Internal Medicine | Admitting: Internal Medicine

## 2017-12-24 DIAGNOSIS — R51 Headache: Secondary | ICD-10-CM

## 2017-12-24 DIAGNOSIS — G44309 Post-traumatic headache, unspecified, not intractable: Secondary | ICD-10-CM

## 2017-12-27 ENCOUNTER — Telehealth: Payer: Self-pay

## 2017-12-27 MED ORDER — ALPRAZOLAM 0.5 MG PO TABS: 0.5000 mg | ORAL_TABLET | Freq: Every evening | ORAL | 0 refills | Status: DC | PRN

## 2017-12-27 NOTE — Telephone Encounter (Signed)
Please ask her to seek Psychiatric consult for dementia management. Xanax called in but she needs some assistance with medication.

## 2017-12-27 NOTE — Telephone Encounter (Signed)
Amanda Atkins patient's daughter called states patient is not sleeping/ resting she stays up tearing up the house, she said she gave her 1/2 of her Xanax and that's the only thing that helps her rest and sleep at night. She wants to know if you would prescribe patient xanax she is requesting the lowest dose.

## 2018-01-06 ENCOUNTER — Ambulatory Visit (INDEPENDENT_AMBULATORY_CARE_PROVIDER_SITE_OTHER): Payer: Medicare Other | Admitting: Internal Medicine

## 2018-01-06 ENCOUNTER — Other Ambulatory Visit: Payer: Self-pay

## 2018-01-06 ENCOUNTER — Encounter: Payer: Self-pay | Admitting: Internal Medicine

## 2018-01-06 ENCOUNTER — Ambulatory Visit
Admission: RE | Admit: 2018-01-06 | Discharge: 2018-01-06 | Disposition: A | Discharge status: Home / Self Care | Payer: Medicare Other | Source: Ambulatory Visit | Attending: Internal Medicine | Admitting: Internal Medicine

## 2018-01-06 VITALS — BP 100/80 | HR 64 | Temp 98.2°F | Resp 26 | Ht 59.0 in | Wt 113.0 lb

## 2018-01-06 DIAGNOSIS — R451 Restlessness and agitation: Secondary | ICD-10-CM | POA: Diagnosis not present

## 2018-01-06 DIAGNOSIS — R0602 Shortness of breath: Secondary | ICD-10-CM | POA: Diagnosis not present

## 2018-01-06 DIAGNOSIS — I4811 Longstanding persistent atrial fibrillation: Secondary | ICD-10-CM

## 2018-01-06 DIAGNOSIS — F0151 Vascular dementia with behavioral disturbance: Secondary | ICD-10-CM | POA: Diagnosis not present

## 2018-01-06 DIAGNOSIS — F01518 Vascular dementia, unspecified severity, with other behavioral disturbance: Secondary | ICD-10-CM

## 2018-01-06 DIAGNOSIS — J181 Lobar pneumonia, unspecified organism: Secondary | ICD-10-CM

## 2018-01-06 DIAGNOSIS — J189 Pneumonia, unspecified organism: Secondary | ICD-10-CM

## 2018-01-06 MED ORDER — DOXYCYCLINE HYCLATE 100 MG PO TABS: 100.0000 mg | ORAL_TABLET | Freq: Two times a day (BID) | ORAL | 0 refills | Status: DC

## 2018-01-06 NOTE — Progress Notes (Signed)
   Subjective:    Patient ID: Amanda Atkins, female    DOB: 07-29-1924, 84 y.o.   MRN: [id number]  HPI 84 year old Female here with caretaker who says pt has been short of breath. Slightly winded walking up sidewalk to office. Has been coughing some not a lot. No fever. No c/o of chills but pt has dementia.    Review of Systems No complaint of malaise or fatigue. Pleasantly confused. Issues with confusion worse at night according to caretaker. May need Psych med consult for insomnia dementia.have tried Xyprexa     Objective:   Physical Exam Blood pressure 100/80 pulse 64 irregular, her respiratory rate 26 on arrival but normal when I examined her.  Temperature 98.2 degrees.  Pulse oximetry 96%.  Weight 113 pounds.  Skin warm and dry.  Nodes none.  TMs and pharynx are clear.  Neck is supple.  Chest is fairly clear to auscultation without any rales or wheezing.  No lower extremity edema.  Cardiac exam irregular irregular rhythm consistent with atrial fibrillation.  Oriented only to self.       Assessment & Plan:  Complaint of cough-do not hear rales in chest today.  Consider congestive heart failure.  BNP drawn.  Is to have chest x-ray.  Agitation related to dementia-seems to occur almost every evening according to caretaker.  Has been treated with Zyprexa but may not be working well for her since symptoms persist  Memory loss-oriented only to self-long-standing  Atrial fibrillation-rate controlled  Plan: Chest x-ray shows right middle lobe pneumonia.  BNP is pending.  Patient will be treated with doxycycline 100 mg twice daily for 10 days with follow-up next week on Monday.  Ask daughter to consider psychiatric consult for medication management regarding agitation related to dementia.  25 minutes spent with patient and caretaker in addition to reviewing x-ray results and prescribing medications.  BNP pending.

## 2018-01-06 NOTE — Patient Instructions (Addendum)
Doxycycline 100 mg twice a day x 10 days. Follow up Monday. BNP pending.  Call if symptoms worsen.

## 2018-01-07 LAB — BRAIN NATRIURETIC PEPTIDE: Brain Natriuretic Peptide: 234 pg/mL — ABNORMAL HIGH (ref <100)

## 2018-01-09 ENCOUNTER — Encounter (HOSPITAL_COMMUNITY): Payer: Self-pay | Admitting: *Deleted

## 2018-01-09 ENCOUNTER — Emergency Department (HOSPITAL_COMMUNITY): Payer: Medicare Other

## 2018-01-09 ENCOUNTER — Inpatient Hospital Stay (HOSPITAL_COMMUNITY)
Admission: EM | Admit: 2018-01-09 | Discharge: 2018-01-11 | DRG: 189 | Disposition: A | Discharge status: Home Health | Payer: Medicare Other | Attending: Internal Medicine | Admitting: Internal Medicine

## 2018-01-09 ENCOUNTER — Inpatient Hospital Stay (HOSPITAL_COMMUNITY): Payer: Medicare Other

## 2018-01-09 DIAGNOSIS — F0150 Vascular dementia without behavioral disturbance: Secondary | ICD-10-CM | POA: Diagnosis not present

## 2018-01-09 DIAGNOSIS — J9601 Acute respiratory failure with hypoxia: Principal | ICD-10-CM | POA: Diagnosis present

## 2018-01-09 DIAGNOSIS — R627 Adult failure to thrive: Secondary | ICD-10-CM | POA: Diagnosis present

## 2018-01-09 DIAGNOSIS — E876 Hypokalemia: Secondary | ICD-10-CM | POA: Diagnosis present

## 2018-01-09 DIAGNOSIS — F0390 Unspecified dementia without behavioral disturbance: Secondary | ICD-10-CM | POA: Diagnosis present

## 2018-01-09 DIAGNOSIS — I4891 Unspecified atrial fibrillation: Secondary | ICD-10-CM | POA: Diagnosis not present

## 2018-01-09 DIAGNOSIS — I5033 Acute on chronic diastolic (congestive) heart failure: Secondary | ICD-10-CM | POA: Diagnosis present

## 2018-01-09 DIAGNOSIS — Z9181 History of falling: Secondary | ICD-10-CM

## 2018-01-09 DIAGNOSIS — Z79899 Other long term (current) drug therapy: Secondary | ICD-10-CM | POA: Diagnosis not present

## 2018-01-09 DIAGNOSIS — Z87891 Personal history of nicotine dependence: Secondary | ICD-10-CM

## 2018-01-09 DIAGNOSIS — I480 Paroxysmal atrial fibrillation: Secondary | ICD-10-CM | POA: Diagnosis present

## 2018-01-09 DIAGNOSIS — R1310 Dysphagia, unspecified: Secondary | ICD-10-CM | POA: Diagnosis present

## 2018-01-09 HISTORY — DX: Unspecified atrial fibrillation: I48.91

## 2018-01-09 LAB — COMPREHENSIVE METABOLIC PANEL
ALT: 17 U/L (ref 0–44)
AST: 24 U/L (ref 15–41)
Albumin: 3.2 g/dL — ABNORMAL LOW (ref 3.5–5.0)
Alkaline Phosphatase: 74 U/L (ref 38–126)
Anion gap: 9 (ref 5–15)
BUN: 19 mg/dL (ref 8–23)
CO2: 23 mmol/L (ref 22–32)
Calcium: 9 mg/dL (ref 8.9–10.3)
Chloride: 108 mmol/L (ref 98–111)
Creatinine, Ser: 0.84 mg/dL (ref 0.44–1.00)
GFR calc Af Amer: >60 mL/min (ref >60)
GFR calc non Af Amer: 59 mL/min — ABNORMAL LOW (ref >60)
Glucose, Bld: 111 mg/dL — ABNORMAL HIGH (ref 70–99)
Potassium: 3.7 mmol/L (ref 3.5–5.1)
Sodium: 140 mmol/L (ref 135–145)
Total Bilirubin: 0.4 mg/dL (ref 0.3–1.2)
Total Protein: 6.1 g/dL — ABNORMAL LOW (ref 6.5–8.1)

## 2018-01-09 LAB — CBC WITH DIFFERENTIAL/PLATELET
Abs Immature Granulocytes: 0.02 10*3/uL (ref 0.00–0.07)
Basophils Absolute: 0.1 10*3/uL (ref 0.0–0.1)
Basophils Relative: 2 %
Eosinophils Absolute: 0.2 10*3/uL (ref 0.0–0.5)
Eosinophils Relative: 3 %
HCT: 35 % — ABNORMAL LOW (ref 36.0–46.0)
Hemoglobin: 10.7 g/dL — ABNORMAL LOW (ref 12.0–15.0)
Immature Granulocytes: 0 %
Lymphocytes Relative: 16 %
Lymphs Abs: 0.9 10*3/uL (ref 0.7–4.0)
MCH: 28.5 pg (ref 26.0–34.0)
MCHC: 30.6 g/dL (ref 30.0–36.0)
MCV: 93.1 fL (ref 80.0–100.0)
Monocytes Absolute: 0.9 10*3/uL (ref 0.1–1.0)
Monocytes Relative: 16 %
Neutro Abs: 3.7 10*3/uL (ref 1.7–7.7)
Neutrophils Relative %: 63 %
Platelets: 241 10*3/uL (ref 150–400)
RBC: 3.76 MIL/uL — ABNORMAL LOW (ref 3.87–5.11)
RDW: 15.4 % (ref 11.5–15.5)
WBC: 5.8 10*3/uL (ref 4.0–10.5)
nRBC: 0 % (ref 0.0–0.2)

## 2018-01-09 LAB — I-STAT CG4 LACTIC ACID, ED
Lactic Acid, Venous: 1.47 mmol/L (ref 0.5–1.9)
Lactic Acid, Venous: 0.91 mmol/L (ref 0.5–1.9)

## 2018-01-09 LAB — URINALYSIS, ROUTINE W REFLEX MICROSCOPIC
Bilirubin Urine: NEGATIVE
Glucose, UA: NEGATIVE mg/dL
Hgb urine dipstick: NEGATIVE
Ketones, ur: NEGATIVE mg/dL
Leukocytes, UA: NEGATIVE
Nitrite: NEGATIVE
Protein, ur: NEGATIVE mg/dL
Specific Gravity, Urine: 1.015 (ref 1.005–1.030)
pH: 5 (ref 5.0–8.0)

## 2018-01-09 LAB — I-STAT TROPONIN, ED: Troponin i, poc: 0 ng/mL (ref 0.00–0.08)

## 2018-01-09 LAB — MAGNESIUM: Magnesium: 1.8 mg/dL (ref 1.7–2.4)

## 2018-01-09 LAB — STREP PNEUMONIAE URINARY ANTIGEN: STREP PNEUMO URINARY ANTIGEN: NEGATIVE

## 2018-01-09 LAB — LIPASE, BLOOD: Lipase: 40 U/L (ref 11–51)

## 2018-01-09 LAB — PROCALCITONIN: Procalcitonin: <0.1 ng/mL

## 2018-01-09 LAB — MRSA PCR SCREENING: MRSA by PCR: NEGATIVE

## 2018-01-09 LAB — BRAIN NATRIURETIC PEPTIDE: B Natriuretic Peptide: 267.3 pg/mL — ABNORMAL HIGH (ref 0.0–100.0)

## 2018-01-09 MED ORDER — DILTIAZEM LOAD VIA INFUSION
10.0000 mg | Freq: Once | INTRAVENOUS | Status: AC
  Administered 2018-01-09: 10 mg via INTRAVENOUS
  Filled 2018-01-09: qty 10

## 2018-01-09 MED ORDER — SODIUM CHLORIDE 0.9 % IV SOLN
500.0000 mg | Freq: Once | INTRAVENOUS | Status: AC
  Administered 2018-01-10: 500 mg via INTRAVENOUS
  Filled 2018-01-09: qty 500

## 2018-01-09 MED ORDER — ALPRAZOLAM 0.5 MG PO TABS
0.5000 mg | ORAL_TABLET | Freq: Every evening | ORAL | Status: DC | PRN
  Administered 2018-01-09: 0.5 mg via ORAL
  Filled 2018-01-09: qty 1

## 2018-01-09 MED ORDER — VANCOMYCIN HCL IN DEXTROSE 1-5 GM/200ML-% IV SOLN
1000.0000 mg | Freq: Once | INTRAVENOUS | Status: AC
  Administered 2018-01-10: 1000 mg via INTRAVENOUS
  Filled 2018-01-09: qty 200

## 2018-01-09 MED ORDER — FUROSEMIDE 10 MG/ML IJ SOLN
20.0000 mg | Freq: Once | INTRAMUSCULAR | Status: DC
  Filled 2018-01-09: qty 2

## 2018-01-09 MED ORDER — SODIUM CHLORIDE 0.9 % IV SOLN
1.0000 g | Freq: Once | INTRAVENOUS | Status: AC
  Administered 2018-01-09: 1 g via INTRAVENOUS
  Filled 2018-01-09: qty 10

## 2018-01-09 MED ORDER — IBUPROFEN 200 MG PO TABS: 400.0000 mg | ORAL_TABLET | Freq: Four times a day (QID) | ORAL | Status: DC | PRN

## 2018-01-09 MED ORDER — ENOXAPARIN SODIUM 40 MG/0.4ML ~~LOC~~ SOLN
40.0000 mg | SUBCUTANEOUS | Status: DC
  Administered 2018-01-09: 40 mg via SUBCUTANEOUS
  Filled 2018-01-09 (×2): qty 0.4

## 2018-01-09 MED ORDER — DILTIAZEM HCL-DEXTROSE 100-5 MG/100ML-% IV SOLN (PREMIX)
5.0000 mg/h | INTRAVENOUS | Status: AC
  Administered 2018-01-09: 5 mg/h via INTRAVENOUS
  Filled 2018-01-09: qty 100

## 2018-01-09 NOTE — ED Triage Notes (Signed)
Pt in from private residence via [facility], per report pt has caregiver and daughter who reports she has SOB, pt denies symptoms, [facility] and was diagnosed with pna, pt given Doxycline, pt on RA & speaks in full sentences, no use of accessory muscles, A&O x3, dosoriented to time at baseline

## 2018-01-09 NOTE — ED Notes (Signed)
Pt ambulated to the bathroom. Pt's O2 dropped to 87%. Pt was around 95%, prior to ambulating. Informed Mina - PA.

## 2018-01-09 NOTE — ED Notes (Signed)
[facility] cannot take the pt  They have no monitor available
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
## 2018-01-09 NOTE — ED Notes (Signed)
Unable to get pt's urine.
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
## 2018-01-09 NOTE — ED Notes (Signed)
Report called to rn on 2w 

## 2018-01-10 ENCOUNTER — Ambulatory Visit (HOSPITAL_COMMUNITY): Payer: Medicare Other

## 2018-01-10 ENCOUNTER — Other Ambulatory Visit: Payer: Self-pay

## 2018-01-10 ENCOUNTER — Ambulatory Visit: Payer: Medicare Other | Admitting: Internal Medicine

## 2018-01-10 DIAGNOSIS — E876 Hypokalemia: Secondary | ICD-10-CM

## 2018-01-10 DIAGNOSIS — I5033 Acute on chronic diastolic (congestive) heart failure: Secondary | ICD-10-CM

## 2018-01-10 LAB — MAGNESIUM: Magnesium: 1.8 mg/dL (ref 1.7–2.4)

## 2018-01-10 LAB — CBC WITH DIFFERENTIAL/PLATELET
ABS IMMATURE GRANULOCYTES: 0.03 10*3/uL (ref 0.00–0.07)
BASOS ABS: 0.1 10*3/uL (ref 0.0–0.1)
Basophils Relative: 1 %
Eosinophils Absolute: 0.3 10*3/uL (ref 0.0–0.5)
Eosinophils Relative: 3 %
HCT: 35.1 % — ABNORMAL LOW (ref 36.0–46.0)
HEMOGLOBIN: 10.7 g/dL — AB (ref 12.0–15.0)
Immature Granulocytes: 0 %
LYMPHS PCT: 17 %
Lymphs Abs: 1.4 10*3/uL (ref 0.7–4.0)
MCH: 27.7 pg (ref 26.0–34.0)
MCHC: 30.5 g/dL (ref 30.0–36.0)
MCV: 90.9 fL (ref 80.0–100.0)
MONO ABS: 1.1 10*3/uL — AB (ref 0.1–1.0)
Monocytes Relative: 14 %
NEUTROS ABS: 5.2 10*3/uL (ref 1.7–7.7)
NRBC: 0 % (ref 0.0–0.2)
Neutrophils Relative %: 65 %
Platelets: 263 10*3/uL (ref 150–400)
RBC: 3.86 MIL/uL — AB (ref 3.87–5.11)
RDW: 15.5 % (ref 11.5–15.5)
WBC: 8.2 10*3/uL (ref 4.0–10.5)

## 2018-01-10 LAB — COMPREHENSIVE METABOLIC PANEL
ALBUMIN: 3.4 g/dL — AB (ref 3.5–5.0)
ALK PHOS: 76 U/L (ref 38–126)
ALT: 18 U/L (ref 0–44)
AST: 23 U/L (ref 15–41)
Anion gap: 9 (ref 5–15)
BUN: 15 mg/dL (ref 8–23)
CALCIUM: 9 mg/dL (ref 8.9–10.3)
CO2: 23 mmol/L (ref 22–32)
CREATININE: 0.83 mg/dL (ref 0.44–1.00)
Chloride: 110 mmol/L (ref 98–111)
GFR calc Af Amer: >60 mL/min (ref >60)
GFR calc non Af Amer: 60 mL/min — ABNORMAL LOW (ref >60)
GLUCOSE: 101 mg/dL — AB (ref 70–99)
Potassium: 3.4 mmol/L — ABNORMAL LOW (ref 3.5–5.1)
SODIUM: 142 mmol/L (ref 135–145)
Total Bilirubin: 0.9 mg/dL (ref 0.3–1.2)
Total Protein: 6.2 g/dL — ABNORMAL LOW (ref 6.5–8.1)

## 2018-01-10 LAB — INFLUENZA PANEL BY PCR (TYPE A & B)
Influenza A By PCR: NEGATIVE
Influenza B By PCR: NEGATIVE

## 2018-01-10 LAB — PROCALCITONIN: Procalcitonin: <0.1 ng/mL

## 2018-01-10 MED ORDER — RESOURCE THICKENUP CLEAR PO POWD
ORAL | Status: DC | PRN
  Filled 2018-01-10: qty 125

## 2018-01-10 MED ORDER — POLYETHYLENE GLYCOL 3350 17 G PO PACK
17.0000 g | PACK | Freq: Every day | ORAL | Status: DC
  Administered 2018-01-10 – 2018-01-11 (×2): 17 g via ORAL
  Filled 2018-01-10 (×2): qty 1

## 2018-01-10 MED ORDER — FUROSEMIDE 10 MG/ML IJ SOLN
20.0000 mg | Freq: Once | INTRAMUSCULAR | Status: AC
  Administered 2018-01-10: 20 mg via INTRAVENOUS

## 2018-01-10 MED ORDER — AMOXICILLIN-POT CLAVULANATE 875-125 MG PO TABS
1.0000 | ORAL_TABLET | Freq: Two times a day (BID) | ORAL | Status: DC
  Administered 2018-01-10 (×2): 1 via ORAL
  Filled 2018-01-10 (×3): qty 1

## 2018-01-10 MED ORDER — ORAL CARE MOUTH RINSE
15.0000 mL | Freq: Two times a day (BID) | OROMUCOSAL | Status: DC
  Administered 2018-01-10 – 2018-01-11 (×2): 15 mL via OROMUCOSAL

## 2018-01-10 MED ORDER — FUROSEMIDE 10 MG/ML IJ SOLN
20.0000 mg | Freq: Every day | INTRAMUSCULAR | Status: DC
  Administered 2018-01-10 – 2018-01-11 (×2): 20 mg via INTRAVENOUS
  Filled 2018-01-10 (×2): qty 2

## 2018-01-10 MED ORDER — MAGNESIUM SULFATE 2 GM/50ML IV SOLN
2.0000 g | Freq: Once | INTRAVENOUS | Status: AC
  Administered 2018-01-10: 2 g via INTRAVENOUS
  Filled 2018-01-10: qty 50

## 2018-01-10 MED ORDER — POTASSIUM CHLORIDE CRYS ER 20 MEQ PO TBCR
40.0000 meq | EXTENDED_RELEASE_TABLET | Freq: Once | ORAL | Status: AC
  Administered 2018-01-10: 40 meq via ORAL
  Filled 2018-01-10: qty 2

## 2018-01-10 MED ORDER — ENOXAPARIN SODIUM 30 MG/0.3ML ~~LOC~~ SOLN
30.0000 mg | SUBCUTANEOUS | Status: DC
  Administered 2018-01-10: 30 mg via SUBCUTANEOUS
  Filled 2018-01-10: qty 0.3

## 2018-01-10 MED ORDER — BISACODYL 10 MG RE SUPP
10.0000 mg | Freq: Every day | RECTAL | Status: DC
  Administered 2018-01-10: 10 mg via RECTAL
  Filled 2018-01-10 (×2): qty 1

## 2018-01-10 MED ORDER — DILTIAZEM HCL 30 MG PO TABS
30.0000 mg | ORAL_TABLET | Freq: Four times a day (QID) | ORAL | Status: DC
  Administered 2018-01-10 – 2018-01-11 (×3): 30 mg via ORAL
  Filled 2018-01-10 (×4): qty 1

## 2018-01-10 MED ORDER — SENNOSIDES-DOCUSATE SODIUM 8.6-50 MG PO TABS
1.0000 | ORAL_TABLET | Freq: Two times a day (BID) | ORAL | Status: DC
  Administered 2018-01-10 – 2018-01-11 (×2): 1 via ORAL
  Filled 2018-01-10 (×3): qty 1

## 2018-01-10 NOTE — Progress Notes (Signed)
  Echocardiogram 2D Echocardiogram has been attempted. Physician consult followed by patient using restroom. Reattempt another time.  Amanda Atkins 01/10/2018, 10:17 AM

## 2018-01-10 NOTE — Progress Notes (Signed)
PT Cancellation Note  Patient Details Name: Amanda Atkins MRN: [id number] DOB: 06-Dec-1924   Cancelled Treatment:    Reason Eval/Treat Not Completed: Active bedrest order attempted to work with patient, noted active bed rest order and have paged attending MD regarding this order. Will attempt PT eval if time/schedule allow once bed rest order is lifted.    Nedra HaiKristen Violett Hobbs PT, DPT, CBIS  Supplemental Physical Therapist [facility]    Pager [phone] Acute Rehab Office [phone]

## 2018-01-10 NOTE — Care Management Note (Signed)
Case Management Note  Patient Details  Name: Amanda Atkins MRN: [id number] Date of Birth: Apr 03, 1924  Subjective/Objective:    From home with family, presents with acute hypoxemic resp failure, afib with rvr, dementia. Await pt eval.  Daughter in room , [facility].                  Action/Plan: NCM will follow for transition of care needs.  Expected Discharge Date:                  Expected Discharge Plan:  Home w Home Health Services  In-House Referral:     Discharge planning Services  CM Consult  Post Acute Care Choice:    Choice offered to:     DME Arranged:    DME Agency:     HH Arranged:    HH Agency:     Status of Service:  In process, will continue to follow  If discussed at Long Length of Stay Meetings, dates discussed:    Additional Comments:  Leone Havenaylor, Jurnei Latini Clinton, RN 01/10/2018, 3:25 PM
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
## 2018-01-10 NOTE — Progress Notes (Addendum)
PROGRESS NOTE  Amanda Atkins DOB: 1924/03/13 DOA: 01/09/2018 PCP: Margaree MackintoshBaxley, Mary J, MD  HPI/Recap of past 24 hours:  She is alert and calm but not oriented to time/place She reports feeling better Personal caregiver at bedside  Assessment/Plan: Active Problems:   Acute hypoxemic respiratory failure (HCC)   Acute hypoxic respiratory failure -cxr bilateral infiltrate,  flu swab negative, procalcitonin less than 0.1, urine strep pneumo negative, she does not have fever, no leukocytosis, she does not appear septic, will d/c iv abx (she was given vancx1, rocephinx1, zithromax x1 in the ED), will start oral augmentin for now ( she does has aspiration risk, she is started on thick liquid after speech eval) -bnp elevated, troponin negative, on exam suspect volume overload, she did well with iv lasix 20mg  , will start daily -awaiting for echocardiogram - strict intake and output -improving, wean o2, start PT eval.  Hypokalemia/hypomagnesemia: Replace k/mag  PAF/Afib/RVR -rate controlled on cardizem drip, she is not a candidate for anticoagulation -echo pending -transition to oral cardizem  Dementia: At risk of delirium, family aware, will need safety sitter  DVT prophylaxis: lovenox  Improving, transfer from stepdown to med tele  Code Status: full  Family Communication: patient and personal caregiver at bedside  Disposition Plan: home with home health, likely on 11/26   Consultants:  none  Procedures:  none  Antibiotics:  As above   Objective: BP 136/83 (BP Location: Right Arm)   Pulse 78   Temp 98.7 F (37.1 C) (Oral)   Resp 20   Ht 4\' 11"  (1.499 m)   Wt 51.2 kg   SpO2 95%   BMI 22.80 kg/m   Intake/Output Summary (Last 24 hours) at 01/10/2018 0732 Last data filed at 01/10/2018 0440 Gross per 24 hour  Intake 483.08 ml  Output 950 ml  Net -466.92 ml   Filed Weights   01/09/18 2000 01/10/18 0500  Weight: 50.9 kg 51.2 kg     Exam: Patient is examined daily including today on 01/10/2018, exams remain the same as of yesterday except that has changed    General:  NAD, demented elderly only oriented to person today  Cardiovascular: IRRR, tachycardia has improved  Respiratory: CTABL  Abdomen: Soft/ND/NT, positive BS  Musculoskeletal: trace pitting Edema has resolved  Neuro: alert, oriented to person only  Data Reviewed: Basic Metabolic Panel: Recent Labs  Lab 01/09/18 1344 01/09/18 1616 01/10/18 0216  NA 140  --  142  K 3.7  --  3.4*  CL 108  --  110  CO2 23  --  23  GLUCOSE 111*  --  101*  BUN 19  --  15  CREATININE 0.84  --  0.83  CALCIUM 9.0  --  9.0  MG  --  1.8 1.8   Liver Function Tests: Recent Labs  Lab 01/09/18 1344 01/10/18 0216  AST 24 23  ALT 17 18  ALKPHOS 74 76  BILITOT 0.4 0.9  PROT 6.1* 6.2*  ALBUMIN 3.2* 3.4*   Recent Labs  Lab 01/09/18 1344  LIPASE 40   No results for input(s): AMMONIA in the last 168 hours. CBC: Recent Labs  Lab 01/09/18 1350 01/10/18 0216  WBC 5.8 8.2  NEUTROABS 3.7 5.2  HGB 10.7* 10.7*  HCT 35.0* 35.1*  MCV 93.1 90.9  PLT 241 263   Cardiac Enzymes:   No results for input(s): CKTOTAL, CKMB, CKMBINDEX, TROPONINI in the last 168 hours. BNP (last 3 results) Recent Labs    01/06/18  1158 01/09/18 1616  BNP 234* 267.3*    ProBNP (last 3 results) No results for input(s): PROBNP in the last 8760 hours.  CBG: No results for input(s): GLUCAP in the last 168 hours.  Recent Results (from the past 240 hour(s))  MRSA PCR Screening     Status: None   Collection Time: 01/09/18  9:05 PM  Result Value Ref Range Status   MRSA by PCR NEGATIVE NEGATIVE Final    Comment:        The GeneXpert MRSA Assay (FDA approved for NASAL specimens only), is one component of a comprehensive MRSA colonization surveillance program. It is not intended to diagnose MRSA infection nor to guide or monitor treatment for MRSA infections. Performed at  [facility], [address] [place], [place] [address]      Studies: Dg Chest 2 View  Result Date: 01/09/2018 CLINICAL DATA:  Chest pain and shortness of breath. EXAM: CHEST - 2 VIEW COMPARISON:  Radiographs 01/06/2018 and 12/17/2017. Older comparison studies are also correlated. FINDINGS: Stable cardiomegaly and aortic atherosclerosis. There is persistent density at the right lung base with blunting of the right costophrenic angle on the frontal examination, suspicious for a right pleural effusion. There are diffuse bilateral pulmonary opacities which may reflect edema based the appearance on the frontal examination. These appear slightly more heterogeneous on the lateral view and could reflect bronchopneumonia. There is no consolidation or definite lobar collapse. Thoracolumbar compression deformities are grossly stable. IMPRESSION: Persistent diffuse bilateral airspace opacities and probable right pleural effusion, similar to recent prior study allowing for technical differences. Findings may reflect edema or pneumonia. Electronically Signed   By: Carey Bullocks M.D.   On: 01/09/2018 15:23   Dg Abd Portable 1 View  Result Date: 01/09/2018 CLINICAL DATA:  Generalized abdominal pain for 1 week EXAM: PORTABLE ABDOMEN - 1 VIEW COMPARISON:  Lumbar spine radiographs from 12/08/2017 FINDINGS: Indistinct left hemidiaphragm, possibly from a left lower lobe airspace opacity. Mild blunting of the right lateral costophrenic angle. Gas and likely formed stool in the colon.  No dilated bowel. Aortoiliac atherosclerotic vascular disease. Lumbar spondylosis and degenerative disc disease. Old compression fracture at L1. IMPRESSION: 1. Unremarkable bowel gas pattern. 2. Indistinct left hemidiaphragm, cannot exclude left lower lobe consolidation. 3. Mild blunting of the right lateral costophrenic angle suggesting a small right pleural effusion. 4. Lumbar spondylosis and degenerative disc disease. Old  compression fracture at L1. 5.  Aortic Atherosclerosis (ICD10-I70.0). Electronically Signed   By: Gaylyn Rong M.D.   On: 01/09/2018 16:37    Scheduled Meds: . amoxicillin-clavulanate  1 tablet Oral Q12H  . enoxaparin (LOVENOX) injection  40 mg Subcutaneous Q24H  . furosemide  20 mg Intravenous Daily  . potassium chloride  40 mEq Oral Once    Continuous Infusions: . diltiazem (CARDIZEM) infusion Stopped (01/10/18 0012)  . magnesium sulfate 1 - 4 g bolus IVPB       Time spent: I have personally reviewed and interpreted on  01/10/2018 daily labs, tele strips, imagings as discussed above under date review session and assessment and plans.  I reviewed all nursing notes, pharmacy notes, consultant notes,  vitals, pertinent old records  I have discussed plan of care as described above with RN , patient and family on 01/10/2018   Albertine Grates MD, PhD  [facility] Pager [phone]. If 7PM-7AM, please contact night-coverage at [email or web address], password [facility] 01/10/2018, 7:32 AM  LOS: 1 day

## 2018-01-11 ENCOUNTER — Inpatient Hospital Stay (HOSPITAL_COMMUNITY): Payer: Medicare Other

## 2018-01-11 DIAGNOSIS — I4891 Unspecified atrial fibrillation: Secondary | ICD-10-CM

## 2018-01-11 LAB — BASIC METABOLIC PANEL
ANION GAP: 10 (ref 5–15)
BUN: 15 mg/dL (ref 8–23)
CALCIUM: 9.1 mg/dL (ref 8.9–10.3)
CO2: 22 mmol/L (ref 22–32)
CREATININE: 0.98 mg/dL (ref 0.44–1.00)
Chloride: 107 mmol/L (ref 98–111)
GFR, EST AFRICAN AMERICAN: 57 mL/min — AB (ref >60)
GFR, EST NON AFRICAN AMERICAN: 49 mL/min — AB (ref >60)
Glucose, Bld: 95 mg/dL (ref 70–99)
Potassium: 4.1 mmol/L (ref 3.5–5.1)
Sodium: 139 mmol/L (ref 135–145)

## 2018-01-11 LAB — ECHOCARDIOGRAM COMPLETE
Height: 59 in
Weight: 1679.02 oz

## 2018-01-11 LAB — PROCALCITONIN: Procalcitonin: <0.1 ng/mL

## 2018-01-11 LAB — MAGNESIUM: MAGNESIUM: 2.3 mg/dL (ref 1.7–2.4)

## 2018-01-11 LAB — BRAIN NATRIURETIC PEPTIDE: B NATRIURETIC PEPTIDE 5: 131.6 pg/mL — AB (ref 0.0–100.0)

## 2018-01-11 MED ORDER — AMOXICILLIN-POT CLAVULANATE 500-125 MG PO TABS
1.0000 | ORAL_TABLET | Freq: Two times a day (BID) | ORAL | Status: DC
  Administered 2018-01-11: 500 mg via ORAL
  Filled 2018-01-11: qty 1

## 2018-01-11 MED ORDER — DILTIAZEM HCL ER COATED BEADS 180 MG PO CP24
180.0000 mg | ORAL_CAPSULE | Freq: Every day | ORAL | Status: DC
  Administered 2018-01-11: 180 mg via ORAL
  Filled 2018-01-11: qty 1

## 2018-01-11 MED ORDER — SENNOSIDES-DOCUSATE SODIUM 8.6-50 MG PO TABS: 1.0000 | ORAL_TABLET | Freq: Every day | ORAL | 0 refills | Status: AC

## 2018-01-11 MED ORDER — AMOXICILLIN-POT CLAVULANATE 500-125 MG PO TABS: 1.0000 | ORAL_TABLET | Freq: Two times a day (BID) | ORAL | 0 refills | Status: AC

## 2018-01-11 MED ORDER — MAGNESIUM GLUCONATE 30 MG PO TABS: 30.0000 mg | ORAL_TABLET | Freq: Every day | ORAL | 0 refills | Status: DC

## 2018-01-11 MED ORDER — RESOURCE THICKENUP CLEAR PO POWD: ORAL | 0 refills | Status: DC

## 2018-01-11 MED ORDER — DILTIAZEM HCL ER COATED BEADS 180 MG PO CP24: 180.0000 mg | ORAL_CAPSULE | Freq: Every day | ORAL | 0 refills | Status: DC

## 2018-01-11 MED ORDER — POTASSIUM CHLORIDE CRYS ER 20 MEQ PO TBCR: 20.0000 meq | EXTENDED_RELEASE_TABLET | ORAL | 0 refills | Status: DC

## 2018-01-11 MED ORDER — FUROSEMIDE 20 MG PO TABS: 20.0000 mg | ORAL_TABLET | ORAL | 0 refills | Status: DC

## 2018-01-11 NOTE — Progress Notes (Signed)
01/11/2018 SATURATION QUALIFICATIONS: (This note is used to comply with regulatory documentation for home oxygen)  Patient Saturations on Room Air at Rest = 94%  Patient Saturations on Room Air while Ambulating = 94%  Patient Saturations on 0 Liters of oxygen while Ambulating = 94%  Please briefly explain why patient needs home oxygen: Based on her current O2 sats, this pt does not currently need home O2.   Thanks,  Rollene Rotundaebecca B. Jaasia Viglione, PT, DPT  Acute Rehabilitation [phone] pager [phone] office

## 2018-01-11 NOTE — Progress Notes (Signed)
  Echocardiogram 2D Echocardiogram has been performed.  Amanda Atkins, Amanda Atkins R 01/11/2018, 9:18 AM

## 2018-01-11 NOTE — Care Management Note (Addendum)
Case Management Note  Patient Details  Name: Amanda Atkins MRN: [id number] Date of Birth: [birth year]  Subjective/Objective:       From home with family, presents with acute hypoxemic resp failure, afib with rvr, dementia. Await pt eval.  Daughter in room , [facility].  11/26 Amanda Capeeborah [name] RN, BSN - daughter [facility] sheet [facility],  [facility] HH[facility], [facility]ST.  ( [facility] could not provide HHOT),  This would be ok just to do the HHPT.  Soc will begin 24-48 hrs post dc.  Patient also has round the clock care at home thru private duty services.   NCM notified by RN, that family wants a bsc, [facility] will deliver to patient's room prior to dc.             Action/Plan: DC home when ready.   Expected Discharge Date:  01/11/18               Expected Discharge Plan:  Home w Home Health Services  In-House Referral:     Discharge planning Services  CM Consult  Post Acute Care Choice:  Home Health Choice offered to:  Adult Children  DME Arranged:   Bedside commode DME Agency:   [facility]  HH Arranged:  RN, Disease Management, PT, Speech Therapy HH Agency:  [facility]  Status of Service:  Completed, signed off  If discussed at Long Length of Stay Meetings, dates discussed:    Additional Comments:  Amanda Atkins, Amanda Branscum Clinton, RN 01/11/2018, 10:26 AM

## 2018-01-11 NOTE — Evaluation (Signed)
Physical Therapy Evaluation Patient Details Name: Amanda Atkins MRN: [id number] DOB: Sep 11, 1924 Today's Date: 01/11/2018   History of Present Illness  84 y.o. female admitted on 01/09/18 for SOB, hypoxemia.  Pt dx with acute hypoxic respiratory failure of unknown cause, hypokalemia/hypomagnesemia, PAF/A-fib with RVR.  Pt with other significant PMH of dementia, back injury, and A-fib.    Clinical Impression  Pt was able to walk assisted around the room and down the hallway.  We attempted using a RW and it was more of an obstacle than a help and she still required min assist for balance with and without the RW.  I do not recommend using one at this time for home, but I did tell her caregiver that the pt needs constant hands on assist when she is up and moving around as she is a very high fall risk, impulsive, with decreased self awareness/safety awareness.  O2 sats were stable at 94% on RA during mobility and pt did not show signs of distress with DOE 0/4 and HR 112.  She is safe to go home from a mobility standpoint with 24/7 caregivers (which she currently has).  The caregiver is interested in a trial of home therapy if pt qualifies and MD agrees with this plan.   PT to follow acutely for deficits listed below.      Follow Up Recommendations Home health PT;Supervision/Assistance - 24 hour    Equipment Recommendations  None recommended by PT    Recommendations for Other Services    NA    Precautions / Restrictions Precautions Precautions: Fall Precaution Comments: very unsteady on her feet and impulsive with poor safety awareness.  Restrictions Weight Bearing Restrictions: No      Mobility  Bed Mobility               General bed mobility comments: Pt was up with RN staff in the hallway  Transfers Overall transfer level: Needs assistance Equipment used: None;Rolling walker (2 wheeled) Transfers: Sit to/from Stand Sit to Stand: Min assist         General transfer  comment: Min assist for balance and to control descent to sit/ensure she hits her sitting target.   Ambulation/Gait Ambulation/Gait assistance: Min assist Gait Distance (Feet): 55 Feet Assistive device: Rolling walker (2 wheeled);None Gait Pattern/deviations: Step-through pattern;Staggering right;Staggering left;Shuffle     General Gait Details: Staff using RW with pt to walk the hallway, however, she is not used to using a RW and the novel device is acting more as an obstacle.  She requires min assist both with and without the RW for gait.  No dyspnea noted during mobility and O2 sats were 94% on RA during gait.       Balance Overall balance assessment: Needs assistance Sitting-balance support: Feet supported;No upper extremity supported;Bilateral upper extremity supported Sitting balance-Leahy Scale: Fair Sitting balance - Comments: pt generally proped on bil UEs.    Standing balance support: Single extremity supported;No upper extremity supported Standing balance-Leahy Scale: Poor Standing balance comment: needs external assist for balance in standing.                              Pertinent Vitals/Pain Pain Assessment: No/denies pain    Home Living Family/patient expects to be discharged to:: Private residence Living Arrangements: Alone Available Help at Discharge: Personal care attendant;Available 24 hours/day(pt has 4 caregivers that rotate shifts throughout the day) Type of Home: House  Home Equipment: Walker - standard Additional Comments: Pt also has an aide    Prior Function Level of Independence: Needs assistance   Gait / Transfers Assistance Needed: supervision for all tasks, but usually walks independently per caregiver.            Hand Dominance   Dominant Hand: Right    Extremity/Trunk Assessment   Upper Extremity Assessment Upper Extremity Assessment: Generalized weakness    Lower Extremity Assessment Lower Extremity  Assessment: Generalized weakness    Cervical / Trunk Assessment Cervical / Trunk Assessment: Kyphotic  Communication   Communication: HOH  Cognition Arousal/Alertness: Awake/alert Behavior During Therapy: Impulsive Overall Cognitive Status: History of cognitive impairments - at baseline                                 General Comments: h/o dementia             Assessment/Plan    PT Assessment Patient needs continued PT services  PT Problem List Decreased strength;Decreased activity tolerance;Decreased balance;Decreased mobility;Decreased coordination;Decreased cognition;Decreased knowledge of use of DME;Decreased safety awareness;Decreased knowledge of precautions       PT Treatment Interventions DME instruction;Gait training;Stair training;Functional mobility training;Therapeutic activities;Therapeutic exercise;Balance training;Cognitive remediation;Patient/family education    PT Goals (Current goals can be found in the Care Plan section)  Acute Rehab PT Goals Patient Stated Goal: to go home PT Goal Formulation: With family(with caregiver) Time For Goal Achievement: 01/25/18 Potential to Achieve Goals: Good    Frequency Min 3X/week           AM-PAC PT "6 Clicks" Mobility  Outcome Measure Help needed turning from your back to your side while in a flat bed without using bedrails?: A Little Help needed moving from lying on your back to sitting on the side of a flat bed without using bedrails?: A Little Help needed moving to and from a bed to a chair (including a wheelchair)?: A Little Help needed standing up from a chair using your arms (e.g., wheelchair or bedside chair)?: A Little Help needed to walk in hospital room?: A Little Help needed climbing 3-5 steps with a railing? : A Little 6 Click Score: 18    End of Session   Activity Tolerance: Patient tolerated treatment well Patient left: in bed;with call bell/phone within reach;with family/visitor  present;with nursing/sitter in room Nurse Communication: Mobility status PT Visit Diagnosis: Muscle weakness (generalized) (M62.81);Unsteadiness on feet (R26.81)    Time: [id number] PT Time Calculation (min) (ACUTE ONLY): 12 min   Charges:       Lurena Joinerebecca B. Safiyah Cisney, PT, DPT  Acute Rehabilitation #[phone]) [phone] pager #[phone] office   PT Evaluation $PT Eval Moderate Complexity: 1 Mod          01/11/2018, 11:10 AM

## 2018-01-11 NOTE — Discharge Summary (Signed)
Discharge Summary  Amanda Atkins ZOX:[id number] DOB: 1924-10-18  PCP: Margaree Mackintosh, MD  Admit date: 01/09/2018 Discharge date: 01/11/2018  Time spent: , more than 50% time spent on coordination of care.  Recommendations for Outpatient Follow-up:  1. F/u with PCP within a week  for hospital discharge follow up, repeat cbc/bmp at follow up 2. afib clinic referral 3. Home health   Discharge Diagnoses:  Active Hospital Problems   Diagnosis Date Noted  . Acute hypoxemic respiratory failure (HCC) 01/09/2018    Resolved Hospital Problems  No resolved problems to display.    Discharge Condition: stable  Diet recommendation: heart healthy  Dysphagia 3 (Mech soft);Nectar-thick liquid   Liquid Administration via: Cup;Straw Medication Administration: Whole meds with puree Supervision: Staff to assist with self feeding;Full supervision/cueing for compensatory strategies Compensations: Minimize environmental distractions;Slow rate;Small sips/bites Postural Changes: Seated upright at 90 degrees;Remain upright for at least 30 minutes after po intake   Filed Weights   01/09/18 2000 01/10/18 0500 01/11/18 0500  Weight: 50.9 kg 51.2 kg 47.6 kg    History of present illness:  PCP: Margaree Mackintosh, MD   Chief Complaint: Shortness breath, hypoxemia  HPI: Amanda Atkins is a 84 y.o. female   History of dementia, paroxysmal A. fib not on anticoagulation due to  Risk of falls and old age per family, she was recently seen by primary care physician she was diagnosed with right lower lobe pneumonia was prescribed doxycycline.  She finished 4 dose of doxycycline however she did not get better.  She complaining of short of breath and request family to call EMS  to bring her the ED. she denies of fever ,no nasal congestion, no sore throat , she denies of chest pain ,she does has nonproductive cough.  ED course:  she is afebrile, she is in A. fib RVR, systolic blood pressure  around 150-160, no hypoxia at rest, though she has tachypnea respiration rate ranged from 24-28.  When she  ambulated to the bedside commode, O2 sats dropped to 87% on room air.  Does not appear septic lactic acid 1.47, WBC normal, renal function normal, troponin 0.  cxr "Persistent diffuse bilateral airspace opacities and probable right pleural effusion, similar to recent prior study allowing for technical differences. Findings may reflect edema or pneumonia." She is started on Cardizem drip for A. fib control, she is given Rocephin and Zithromax, hospitalist called to admit the patient.  Family report she had an accidental fall in October( 6 weeks ago) when she slipped and fell on her back, suffered a T8 compression fracture. She was also seen in the ED for increased agitation on 11/1, she was evaluated by psych and later she was discharged home. Family report patient had increased agitation on Zyprexa this was discontinued .  She was given Xanax qhs prn a week ago by primary care physician which works well.   Hospital Course:  Active Problems:   Acute hypoxemic respiratory failure (HCC)   Acute hypoxic respiratory failure/acute on chronic diastolic chf -she presented with dyspnea, When she  ambulated to the bedside commode, O2 sats dropped to 87% on room air in the ED. -cxr bilateral infiltrate,  flu swab negative, procalcitonin less than 0.1, urine strep pneumo negative, she does not have fever, no leukocytosis, she does not appear septic, she was given vancx1, rocephinx1, zithromax x1 in the ED, she is  started oral augmentin for now ( she does has aspiration risk, she is started on thick liquid  after speech eval) -bnp elevated, troponin negative, on exam + signs of volume overload, she responded well with  iv lasix 20mg  daily -echocardiogram with preserved lvef, mild to moderate regurgitation.not able to evaluate diastolic function due to afib -she diuresed well, negative 2.4liter    -improving, she is weaned off o2, PT eval recommend home health  Dysphagia:  Dysphagia 3 (Mech soft);Nectar-thick liquid   Liquid Administration via: Cup;Straw Medication Administration: Whole meds with puree Supervision: Staff to assist with self feeding;Full supervision/cueing for compensatory strategies Compensations: Minimize environmental distractions;Slow rate;Small sips/bites Postural Changes: Seated upright at 90 degrees;Remain upright for at least 30 minutes after po intake   Home health speech to follow Aspiration precaution  Hypokalemia/hypomagnesemia: k/mag replaced and normalized    PAF/Afib/RVR -she is started on cardizem drip , transitioned to oral cardizem, rate controlled at discharge, remain in afib -she is not a candidate for anticoagulation due to fall risk -echocardiogram with preserved lvef, mild to moderate regurgitation.not able to evaluate diastolic function due to afib -afib clinic referral  Dementia: No significant sign of  Delirium, Sitter and personal care giver with patient in the hospital  FTT: arrange home health  DVT prophylaxis:lovenox  Improving, transfer from stepdown to med tele  Code Status: full  Family Communication: patient and personal caregiver at bedside  Disposition Plan: home with home health on 11/26   Consultants:  none  Procedures:  none  Antibiotics:  As above    Discharge Exam: BP 120/80   Pulse (!) 57   Temp 97.8 F (36.6 C)   Resp 16   Ht 4\' 11"  (1.499 m)   Wt 47.6 kg   SpO2 96%   BMI 21.20 kg/m   General: NAD, demented , but calm and cooperative  Cardiovascular: IRRR Respiratory: CTABL Extremity: trace pitting edema has resolved  Discharge Instructions You were cared for by a hospitalist during your hospital stay. If you have any questions about your discharge medications or the care you received while you were in the hospital after you are discharged, you can call the unit  and asked to speak with the hospitalist on call if the hospitalist that took care of you is not available. Once you are discharged, your primary care physician will handle any further medical issues. Please note that NO REFILLS for any discharge medications will be authorized once you are discharged, as it is imperative that you return to your primary care physician (or establish a relationship with a primary care physician if you do not have one) for your aftercare needs so that they can reassess your need for medications and monitor your lab values.  Discharge Instructions    Amb referral to AFIB Clinic   Complete by:  As directed    Diet - low sodium heart healthy   Complete by:  As directed    Dysphagia 3 (Mech soft);Nectar-thick liquid   Liquid Administration via: Cup;Straw Medication Administration: Whole meds with puree Supervision: Staff to assist with self feeding;Full supervision/cueing for compensatory strategies Compensations: Minimize environmental distractions;Slow rate;Small sips/bites Postural Changes: Seated upright at 90 degrees;Remain upright for at least 30 minutes after po intake   Discharge instructions   Complete by:  As directed    Please weight yourself daily in the morning before breakfast, call your doctor if weight up 3lbs in three days.   Increase activity slowly   Complete by:  As directed      Allergies as of 01/11/2018      Reactions  Gluten Meal Diarrhea   Olanzapine Other (See Comments)   Anxious, irritable, wild looking eyes      Medication List    STOP taking these medications   doxycycline 100 MG tablet Commonly known as:  VIBRA-TABS   HYDROcodone-acetaminophen 5-325 MG tablet Commonly known as:  NORCO/VICODIN     TAKE these medications   ALPRAZolam 0.5 MG tablet Commonly known as:  XANAX Take 1 tablet (0.5 mg total) by mouth at bedtime as needed for anxiety. What changed:  when to take this   amoxicillin-clavulanate 500-125 MG  tablet Commonly known as:  AUGMENTIN Take 1 tablet (500 mg total) by mouth 2 (two) times daily for 5 days.   diltiazem 180 MG 24 hr capsule Commonly known as:  CARDIZEM CD Take 1 capsule (180 mg total) by mouth daily.   furosemide 20 MG tablet Commonly known as:  LASIX Take 1 tablet (20 mg total) by mouth every Monday, Wednesday, and Friday for 24 days. Start taking on:  01/12/2018   ibuprofen 200 MG tablet Commonly known as:  ADVIL,MOTRIN Take 400 mg by mouth every 6 (six) hours as needed (back pain).   magnesium gluconate 30 MG tablet Commonly known as:  MAGONATE Take 1 tablet (30 mg total) by mouth daily.   potassium chloride SA 20 MEQ tablet Commonly known as:  K-DUR,KLOR-CON Take 1 tablet (20 mEq total) by mouth every Monday, Wednesday, and Friday. Start taking on:  01/12/2018   RESOURCE THICKENUP CLEAR Powd Dysphagia 3 (Mech soft);Nectar-thick liquid   Liquid Administration via: Cup;Straw Medication Administration: Whole meds with puree Supervision: Staff to assist with self feeding;Full supervision/cueing for compensatory strategies Compensations: Minimize environmental distractions;Slow rate;Small sips/bites Postural Changes: Seated upright at 90 degrees;Remain upright for at least 30 minutes after po intake   senna-docusate 8.6-50 MG tablet Commonly known as:  Senokot-S Take 1 tablet by mouth at bedtime.            Durable Medical Equipment  (From admission, onward)         Start     Ordered   01/11/18 1126  For home use only DME Bedside commode  Once    Question:  Patient needs a bedside commode to treat with the following condition  Answer:  Weakness   01/11/18 1125         Allergies  Allergen Reactions  . Gluten Meal Diarrhea  . Olanzapine Other (See Comments)    Anxious, irritable, wild looking eyes   Follow-up Information    Baxley, Luanna Cole, MD Follow up in 1 week(s).   Specialty:  Internal Medicine Why:  hospital discharge follow up,  repeat cbc/bmp/mag at follow up. lasix dose adjustment per pcp Contact information: [address] [facility] [id number] [phone]        [facility]-Home Follow up.   Specialty:  Home Health Services Why:  [facility], HHPT, HHST Contact information: [address] [place] [address] [phone]        [facility] - Dme Follow up.   Why:  [facility] Contact information: [address] [place] [address] [phone]            The results of significant diagnostics from this hospitalization (including imaging, microbiology, ancillary and laboratory) are listed below for reference.    Significant Diagnostic Studies: Dg Chest 2 View  Result Date: 01/09/2018 CLINICAL DATA:  Chest pain and shortness of breath. EXAM: CHEST - 2 VIEW COMPARISON:  Radiographs 01/06/2018 and  12/17/2017. Older comparison studies are also correlated. FINDINGS: Stable cardiomegaly and aortic atherosclerosis. There is persistent density at the right lung base with blunting of the right costophrenic angle on the frontal examination, suspicious for a right pleural effusion. There are diffuse bilateral pulmonary opacities which may reflect edema based the appearance on the frontal examination. These appear slightly more heterogeneous on the lateral view and could reflect bronchopneumonia. There is no consolidation or definite lobar collapse. Thoracolumbar compression deformities are grossly stable. IMPRESSION: Persistent diffuse bilateral airspace opacities and probable right pleural effusion, similar to recent prior study allowing for technical differences. Findings may reflect edema or pneumonia. Electronically Signed   By: Carey Bullocks M.D.   On: 01/09/2018 15:23   Dg Chest 2 View  Result Date: 01/06/2018 CLINICAL DATA:  Shortness of breath and cough. Concern for pneumonia EXAM: CHEST - 2 VIEW COMPARISON:  12/17/2017 FINDINGS: Chronic cardiomegaly. Low  volume chest with band of opacity in the right middle lobe. There is also opacification at the lateral right costophrenic sulcus with no fluid seen on the lateral view. No air bronchogram, edema, effusion, or pneumothorax. Remote compression fractures. IMPRESSION: Low volume chest with right middle lobe collapse. Based on the history there may be underlying airway infection/bronchopneumonia. Electronically Signed   By: Marnee Spring M.D.   On: 01/06/2018 12:58   Ct Head Wo Contrast  Result Date: 12/24/2017 CLINICAL DATA:  Persistent headache and dizziness after fall 2 weeks ago. EXAM: CT HEAD WITHOUT CONTRAST TECHNIQUE: Contiguous axial images were obtained from the base of the skull through the vertex without intravenous contrast. COMPARISON:  None. FINDINGS: Brain: No evidence of acute infarction, hemorrhage, hydrocephalus, extra-axial collection or mass lesion/mass effect. Moderate generalized cerebral atrophy. Moderate periventricular and subcortical white matter hypodensities are nonspecific, but favored to reflect chronic microvascular ischemic changes. Vascular: Atherosclerotic vascular calcification of the carotid siphons. No hyperdense vessel. Skull: Negative for fracture or focal lesion. Sinuses/Orbits: No acute finding. Other: None. IMPRESSION: 1.  No acute intracranial abnormality. 2. Moderate atrophy and chronic microvascular ischemic changes. Electronically Signed   By: Obie Dredge M.D.   On: 12/24/2017 11:41   Dg Chest Portable 1 View  Result Date: 12/17/2017 CLINICAL DATA:  Back pain due to compression fracture 2 weeks ago. Dementia. EXAM: PORTABLE CHEST 1 VIEW COMPARISON:  None. FINDINGS: Cardiomegaly. Lungs are clear. No pleural effusion or pneumothorax seen. No acute appearing osseous abnormality. IMPRESSION: Cardiomegaly.  No active disease. Electronically Signed   By: Bary Richard M.D.   On: 12/17/2017 15:53   Dg Abd Portable 1 View  Result Date: 01/09/2018 CLINICAL DATA:   Generalized abdominal pain for 1 week EXAM: PORTABLE ABDOMEN - 1 VIEW COMPARISON:  Lumbar spine radiographs from 12/08/2017 FINDINGS: Indistinct left hemidiaphragm, possibly from a left lower lobe airspace opacity. Mild blunting of the right lateral costophrenic angle. Gas and likely formed stool in the colon.  No dilated bowel. Aortoiliac atherosclerotic vascular disease. Lumbar spondylosis and degenerative disc disease. Old compression fracture at L1. IMPRESSION: 1. Unremarkable bowel gas pattern. 2. Indistinct left hemidiaphragm, cannot exclude left lower lobe consolidation. 3. Mild blunting of the right lateral costophrenic angle suggesting a small right pleural effusion. 4. Lumbar spondylosis and degenerative disc disease. Old compression fracture at L1. 5.  Aortic Atherosclerosis (ICD10-I70.0). Electronically Signed   By: Gaylyn Rong M.D.   On: 01/09/2018 16:37    Microbiology: Recent Results (from the past 240 hour(s))  MRSA PCR Screening     Status: None  Collection Time: 01/09/18  9:05 PM  Result Value Ref Range Status   MRSA by PCR NEGATIVE NEGATIVE Final    Comment:        The GeneXpert MRSA Assay (FDA approved for NASAL specimens only), is one component of a comprehensive MRSA colonization surveillance program. It is not intended to diagnose MRSA infection nor to guide or monitor treatment for MRSA infections. Performed at [facility], [address] [place], [place] [address]      Labs: Basic Metabolic Panel: Recent Labs  Lab 01/09/18 1344 01/09/18 1616 01/10/18 0216 01/11/18 0239  NA 140  --  142 139  K 3.7  --  3.4* 4.1  CL 108  --  110 107  CO2 23  --  23 22  GLUCOSE 111*  --  101* 95  BUN 19  --  15 15  CREATININE 0.84  --  0.83 0.98  CALCIUM 9.0  --  9.0 9.1  MG  --  1.8 1.8 2.3   Liver Function Tests: Recent Labs  Lab 01/09/18 1344 01/10/18 0216  AST 24 23  ALT 17 18  ALKPHOS 74 76  BILITOT 0.4 0.9  PROT 6.1* 6.2*  ALBUMIN 3.2*  3.4*   Recent Labs  Lab 01/09/18 1344  LIPASE 40   No results for input(s): AMMONIA in the last 168 hours. CBC: Recent Labs  Lab 01/09/18 1350 01/10/18 0216  WBC 5.8 8.2  NEUTROABS 3.7 5.2  HGB 10.7* 10.7*  HCT 35.0* 35.1*  MCV 93.1 90.9  PLT 241 263   Cardiac Enzymes: No results for input(s): CKTOTAL, CKMB, CKMBINDEX, TROPONINI in the last 168 hours. BNP: BNP (last 3 results) Recent Labs    01/06/18 1158 01/09/18 1616 01/11/18 0239  BNP 234* 267.3* 131.6*    ProBNP (last 3 results) No results for input(s): PROBNP in the last 8760 hours.  CBG: No results for input(s): GLUCAP in the last 168 hours.     Signed:  Albertine GratesFang Erinn Mendosa MD, PhD  [facility] 01/11/2018, 12:07 PM

## 2018-01-14 ENCOUNTER — Emergency Department (HOSPITAL_COMMUNITY): Payer: Medicare Other

## 2018-01-14 ENCOUNTER — Emergency Department (HOSPITAL_COMMUNITY)
Admission: EM | Admit: 2018-01-14 | Discharge: 2018-01-17 | Payer: Medicare Other | Source: Home / Self Care | Attending: Emergency Medicine | Admitting: Emergency Medicine

## 2018-01-14 ENCOUNTER — Encounter (HOSPITAL_COMMUNITY): Payer: Self-pay | Admitting: Emergency Medicine

## 2018-01-14 DIAGNOSIS — Z79899 Other long term (current) drug therapy: Secondary | ICD-10-CM | POA: Insufficient documentation

## 2018-01-14 DIAGNOSIS — R262 Difficulty in walking, not elsewhere classified: Secondary | ICD-10-CM | POA: Insufficient documentation

## 2018-01-14 DIAGNOSIS — Z87891 Personal history of nicotine dependence: Secondary | ICD-10-CM | POA: Insufficient documentation

## 2018-01-14 DIAGNOSIS — R627 Adult failure to thrive: Secondary | ICD-10-CM | POA: Insufficient documentation

## 2018-01-14 DIAGNOSIS — J189 Pneumonia, unspecified organism: Secondary | ICD-10-CM | POA: Diagnosis not present

## 2018-01-14 DIAGNOSIS — F0390 Unspecified dementia without behavioral disturbance: Secondary | ICD-10-CM | POA: Insufficient documentation

## 2018-01-14 DIAGNOSIS — R6251 Failure to thrive (child): Secondary | ICD-10-CM

## 2018-01-14 DIAGNOSIS — R531 Weakness: Secondary | ICD-10-CM | POA: Insufficient documentation

## 2018-01-14 DIAGNOSIS — R4182 Altered mental status, unspecified: Secondary | ICD-10-CM | POA: Diagnosis not present

## 2018-01-14 LAB — COMPREHENSIVE METABOLIC PANEL
ALT: 21 U/L (ref 0–44)
ANION GAP: 8 (ref 5–15)
AST: 31 U/L (ref 15–41)
Albumin: 3.8 g/dL (ref 3.5–5.0)
Alkaline Phosphatase: 77 U/L (ref 38–126)
BUN: 19 mg/dL (ref 8–23)
CHLORIDE: 106 mmol/L (ref 98–111)
CO2: 26 mmol/L (ref 22–32)
Calcium: 9.3 mg/dL (ref 8.9–10.3)
Creatinine, Ser: 0.85 mg/dL (ref 0.44–1.00)
GFR calc non Af Amer: 60 mL/min — ABNORMAL LOW (ref >60)
GLUCOSE: 153 mg/dL — AB (ref 70–99)
POTASSIUM: 4 mmol/L (ref 3.5–5.1)
SODIUM: 140 mmol/L (ref 135–145)
Total Bilirubin: 0.6 mg/dL (ref 0.3–1.2)
Total Protein: 6.5 g/dL (ref 6.5–8.1)

## 2018-01-14 LAB — I-STAT ARTERIAL BLOOD GAS, ED
Bicarbonate: 24.4 mmol/L (ref 20.0–28.0)
O2 SAT: 96 %
PCO2 ART: 37 mmHg (ref 32.0–48.0)
Patient temperature: 98.6
TCO2: 26 mmol/L (ref 22–32)
pH, Arterial: 7.428 (ref 7.350–7.450)
pO2, Arterial: 81 mmHg — ABNORMAL LOW (ref 83.0–108.0)

## 2018-01-14 LAB — URINALYSIS, ROUTINE W REFLEX MICROSCOPIC
Bilirubin Urine: NEGATIVE
Glucose, UA: NEGATIVE mg/dL
HGB URINE DIPSTICK: NEGATIVE
Ketones, ur: NEGATIVE mg/dL
Leukocytes, UA: NEGATIVE
Nitrite: NEGATIVE
PH: 5 (ref 5.0–8.0)
Protein, ur: NEGATIVE mg/dL
SPECIFIC GRAVITY, URINE: 1.008 (ref 1.005–1.030)

## 2018-01-14 LAB — I-STAT CG4 LACTIC ACID, ED: LACTIC ACID, VENOUS: 1.7 mmol/L (ref 0.5–1.9)

## 2018-01-14 LAB — CBC WITH DIFFERENTIAL/PLATELET
ABS IMMATURE GRANULOCYTES: 0.02 10*3/uL (ref 0.00–0.07)
BASOS PCT: 2 %
Basophils Absolute: 0.1 10*3/uL (ref 0.0–0.1)
EOS ABS: 0.3 10*3/uL (ref 0.0–0.5)
EOS PCT: 5 %
HCT: 38.8 % (ref 36.0–46.0)
Hemoglobin: 11.7 g/dL — ABNORMAL LOW (ref 12.0–15.0)
Immature Granulocytes: 0 %
Lymphocytes Relative: 22 %
Lymphs Abs: 1.2 10*3/uL (ref 0.7–4.0)
MCH: 28.3 pg (ref 26.0–34.0)
MCHC: 30.2 g/dL (ref 30.0–36.0)
MCV: 93.9 fL (ref 80.0–100.0)
Monocytes Absolute: 1 10*3/uL (ref 0.1–1.0)
Monocytes Relative: 18 %
NEUTROS ABS: 2.8 10*3/uL (ref 1.7–7.7)
Neutrophils Relative %: 53 %
PLATELETS: 242 10*3/uL (ref 150–400)
RBC: 4.13 MIL/uL (ref 3.87–5.11)
RDW: 15.2 % (ref 11.5–15.5)
WBC: 5.2 10*3/uL (ref 4.0–10.5)
nRBC: 0 % (ref 0.0–0.2)

## 2018-01-14 MED ORDER — SENNOSIDES-DOCUSATE SODIUM 8.6-50 MG PO TABS
1.0000 | ORAL_TABLET | Freq: Every day | ORAL | Status: DC
  Filled 2018-01-14: qty 1

## 2018-01-14 MED ORDER — RESOURCE THICKENUP CLEAR PO POWD
ORAL | Status: DC | PRN
  Administered 2018-01-17: 11:00:00 via ORAL
  Filled 2018-01-14 (×3): qty 125

## 2018-01-14 MED ORDER — ALPRAZOLAM 0.5 MG PO TABS
0.5000 mg | ORAL_TABLET | Freq: Every evening | ORAL | Status: DC | PRN
  Filled 2018-01-14: qty 1

## 2018-01-14 MED ORDER — IBUPROFEN 400 MG PO TABS: 400.0000 mg | ORAL_TABLET | Freq: Four times a day (QID) | ORAL | Status: DC | PRN

## 2018-01-14 MED ORDER — DILTIAZEM HCL ER COATED BEADS 180 MG PO CP24
180.0000 mg | ORAL_CAPSULE | Freq: Every day | ORAL | Status: DC
  Filled 2018-01-14 (×3): qty 1

## 2018-01-14 MED ORDER — POTASSIUM CHLORIDE 20 MEQ/15ML (10%) PO SOLN
20.0000 meq | ORAL | Status: DC
  Administered 2018-01-17: 20 meq via ORAL
  Filled 2018-01-14 (×2): qty 15

## 2018-01-14 MED ORDER — LORAZEPAM 2 MG/ML IJ SOLN
1.0000 mg | Freq: Once | INTRAMUSCULAR | Status: AC
  Administered 2018-01-14: 1 mg via INTRAVENOUS
  Filled 2018-01-14: qty 1

## 2018-01-14 MED ORDER — MAGNESIUM GLUCONATE 30 MG PO TABS: 30.0000 mg | ORAL_TABLET | Freq: Every day | ORAL | Status: DC

## 2018-01-14 MED ORDER — FUROSEMIDE 20 MG PO TABS
20.0000 mg | ORAL_TABLET | ORAL | Status: DC
  Administered 2018-01-17: 20 mg via ORAL
  Filled 2018-01-14 (×3): qty 1

## 2018-01-14 MED ORDER — SODIUM CHLORIDE 0.9 % IV BOLUS
1000.0000 mL | Freq: Once | INTRAVENOUS | Status: AC
  Administered 2018-01-14: 1000 mL via INTRAVENOUS

## 2018-01-14 MED ORDER — POTASSIUM CHLORIDE CRYS ER 20 MEQ PO TBCR
20.0000 meq | EXTENDED_RELEASE_TABLET | ORAL | Status: DC
  Filled 2018-01-14: qty 1

## 2018-01-14 MED ORDER — AMOXICILLIN-POT CLAVULANATE 500-125 MG PO TABS
1.0000 | ORAL_TABLET | Freq: Two times a day (BID) | ORAL | Status: DC
  Administered 2018-01-16: 500 mg via ORAL
  Filled 2018-01-14 (×7): qty 1

## 2018-01-14 NOTE — ED Notes (Signed)
Patient transported to CT 

## 2018-01-14 NOTE — Progress Notes (Addendum)
Pt was recently discharged with home health. Per family and home health reports, pt unsuccessful at home with home health.   CSW met with pt's daughter, Suanne Marker. CSW explained process for SNF placement with pt's insurance. [name] understands that insurance must authorize payment for SNF before pt can go to SNF. [name] would prefer pt go to [facility], but will pick 3 more for referrals to be sent to. CSW explained from the ED pt may have to go to the first SNF accepting them. Consult for physical therapy will be placed to see pt to determine SNF vs Home Health.   Barriers to placement include, needing to obtain authorization from insurance and [employer] not being open on weekends.   Social work will continue to follow for discharge needs.   Wendelyn Breslow, [facility]  [phone]

## 2018-01-14 NOTE — ED Triage Notes (Signed)
Pt arrives via EMS from home with reports of AMS. Home health states she is baseline A&Ox3. For a day and half, she has been lethargic. For the past hour, she has only been alert to self. 16G LAC. Home health RN states she had some slurred speech and facial droop but unable to give time frame. No slurred speech or facial droop at this time. Denies fever, cough or changes in eating.

## 2018-01-14 NOTE — ED Notes (Signed)
Pt refused all PO meds, food, ice cream, apple sauce, and water.

## 2018-01-14 NOTE — ED Provider Notes (Signed)
Care assumed from Dr. Rosalia Hammersay at shift change.  Patient with recent hospitalization for pneumonia and atrial fibrillation.  She has been started on new medications and has had difficulty at home ambulating since discharge.  The daughter has expressed an inability to care for her and believes that she is not safe.  Patient awaiting results of a CT scan of the head to rule out bleed or stroke.  This was performed and was unremarkable.  I discussed the results with the patient and her daughter.  The daughter has expressed frustration with her mother not returning back to her baseline and feels as though she needs additional care that she cannot provide.  I see nothing that indicates the patient requires hospitalization in the acute care setting, but do believe she may benefit from rehabilitation and physical therapy.  This is been discussed with the case manager who is spoken with the patient and her family.  Arrangements will be made for placement in a rehab facility.  The patient will remain in the emergency department overnight while these arrangements are being made.   Geoffery Lyonselo, Sugar Vanzandt, MD 01/14/18 2243

## 2018-01-14 NOTE — ED Notes (Addendum)
Pt repeatedly removes the bair hugger and will not keep in place.
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
## 2018-01-15 ENCOUNTER — Other Ambulatory Visit: Payer: Self-pay

## 2018-01-15 MED ORDER — HALOPERIDOL LACTATE 5 MG/ML IJ SOLN
2.0000 mg | Freq: Once | INTRAMUSCULAR | Status: AC
  Administered 2018-01-15: 2 mg via INTRAVENOUS
  Filled 2018-01-15: qty 1

## 2018-01-15 NOTE — ED Provider Notes (Signed)
Pt required two doses of haldol 2 mg overnight, tolerated well and resting comfortably. Plan per Dr. Rosalia Hammersay and Delo's note.   Shaune PollackIsaacs, Mariangela Heldt, MD 01/15/18 [phone]

## 2018-01-15 NOTE — Progress Notes (Signed)
No update on auth at this time.     Claude MangesKierra S. Rufina Kimery, MSW, LCSW-A Emergency Department Clinical Social Worker [phone]

## 2018-01-15 NOTE — Progress Notes (Addendum)
1:52pm-CSW spoke with pt's daughter at bedside to give update on placement for pt. Authorization has been started at this time no response yet.  11:10am-CSW [facility] French [facility].   10:16am-CSW spoke with French Anaracy and was informed that she can attempt to start authorization with [facility], however in the past has nto had success for weekend approval wit [employer]. Daughter updated and Berkley Harveyauth may have to began on 01/17/18. French Anaracy to follow up with CSW once Berkley Harveyauth has been received.  9:49am-CSW spoke with French Anaracy from [facility] and was informed that they do have beds. CSW to follow up with French Anaracy and daughter once PT has assessed pt.  9:29am-CSW spoke with pt's daughter Bjorn LoserRhonda to give update on next steps in care. CSW advised Bjorn LoserRhonda that PT is needing to see pt before SNF placement can be started. Bjorn LoserRhonda expressed an interest in [facility] and expressed that she would review list provided to her for further SNF options. CSW to follow up with Bjorn Loserhonda once PT has seen pt.   CSW aware that pt is in need of SNF placement. CSW spoke with MD and requested that PT consult be placed before pt can be placed. CSW will follow for further placement needs.     Claude MangesKierra S. Maya Scholer, MSW, LCSW-A Emergency Department Clinical Social Worker [phone]

## 2018-01-15 NOTE — ED Notes (Signed)
Pt is awake now - being fed by daughter and caregiver.

## 2018-01-15 NOTE — ED Notes (Addendum)
Daughter and Caregiver, Corrie DandyMary, attempted to assist pt w/standing - stated "We wanted to see if she has any strength yet". Pt able to stand - pt assisted back to comfortable position in bed. Encouraged daughter and Caregiver to not attempt to get pt up d/t generalized weakness - voiced understanding.

## 2018-01-15 NOTE — ED Notes (Signed)
Pt arrived to Rm 52 via bed - daughter and caregiver w/pt. Lunch delivered to pt. Pt noted to be sleeping. IV noted to left forearm - coban dressing loosened. Daughter and caregiver leaving to eat lunch at this time and will return.

## 2018-01-15 NOTE — ED Notes (Signed)
Patient continues to be altered; unable to follow directions. Patient changed (diaper, some linens) and placed in paper scrubs. Lights dimmed and bed placed in a position to discourage from getting up. Will continue to monitor.

## 2018-01-15 NOTE — NC FL2 (Signed)
[place] MEDICAID FL2 LEVEL OF CARE SCREENING TOOL     IDENTIFICATION  Patient Name: Amanda Atkins Birthdate: [birth year] Sex: female Admission Date (Current Location): 01/14/2018  [facility] and [place] Number:  Producer, television/film/video and Address:  [facility], [address], [place], [place] [address]      Provider Number: [id number]  Attending Physician Name and Address:  Default, Provider, MD  Relative Name and Phone Number:       Current Level of Care: Hospital Recommended Level of Care: Skilled Nursing Facility Prior Approval Number:    Date Approved/Denied:   PASRR Number:   [id number]   Discharge Plan:      Current Diagnoses: Patient Active Problem List   Diagnosis Date Noted  . Acute hypoxemic respiratory failure (HCC) 01/09/2018  . Dementia with behavioral disturbance (HCC) 12/18/2017  . Memory loss 09/15/2017  . PAF (paroxysmal atrial fibrillation) (HCC) 11/29/2014    Orientation RESPIRATION BLADDER Height & Weight     (pt has a history of dementia. )  Normal Incontinent Weight:   Height:     BEHAVIORAL SYMPTOMS/MOOD NEUROLOGICAL BOWEL NUTRITION STATUS      Incontinent Diet(please AVS. )  AMBULATORY STATUS COMMUNICATION OF NEEDS Skin   Extensive Assist Verbally Normal                       Personal Care Assistance Level of Assistance  Bathing, Feeding, Dressing Bathing Assistance: Maximum assistance Feeding assistance: Limited assistance Dressing Assistance: Maximum assistance     Functional Limitations Info  Sight, Hearing, Speech Sight Info: Adequate Hearing Info: Adequate Speech Info: Adequate    SPECIAL CARE FACTORS FREQUENCY  PT (By licensed PT), OT (By licensed OT)     PT Frequency: 5 times a week  OT Frequency: 5 times a week             Contractures Contractures Info: Not present    Additional Factors Info  Code Status, Allergies Code Status Info: Prior Allergies Info: Gluten Meal,  Olanzapine           Current Medications (01/15/2018):  This is the current hospital active medication list Current Facility-Administered Medications  Medication Dose Route Frequency Provider Last Rate Last Dose  . ALPRAZolam (XANAX) tablet 0.5 mg  0.5 mg Oral QHS PRN Geoffery Lyons, MD      . amoxicillin-clavulanate (AUGMENTIN) 500-125 MG per tablet 500 mg  1 tablet Oral BID Geoffery Lyons, MD      . diltiazem (CARDIZEM CD) 24 hr capsule 180 mg  180 mg Oral Daily Delo, Douglas, MD      . furosemide (LASIX) tablet 20 mg  20 mg Oral Q M,W,F Delo, Douglas, MD      . ibuprofen (ADVIL,MOTRIN) tablet 400 mg  400 mg Oral Q6H PRN Geoffery Lyons, MD      . potassium chloride 20 MEQ/15ML (10%) solution 20 mEq  20 mEq Oral Q M,W,F Geoffery Lyons, MD      . RESOURCE THICKENUP CLEAR   Oral PRN Geoffery Lyons, MD      . senna-docusate (Senokot-S) tablet 1 tablet  1 tablet Oral QHS Geoffery Lyons, MD       Current Outpatient Medications  Medication Sig Dispense Refill  . ALPRAZolam (XANAX) 0.5 MG tablet Take 1 tablet (0.5 mg total) by mouth at bedtime as needed for anxiety. (Patient taking differently: Take 0.5 mg by mouth at bedtime. ) 30 tablet 0  . amoxicillin-clavulanate (AUGMENTIN)  500-125 MG tablet Take 1 tablet (500 mg total) by mouth 2 (two) times daily for 5 days. 10 tablet 0  . diltiazem (CARDIZEM CD) 180 MG 24 hr capsule Take 1 capsule (180 mg total) by mouth daily. 30 capsule 0  . furosemide (LASIX) 20 MG tablet Take 1 tablet (20 mg total) by mouth every Monday, Wednesday, and Friday for 24 days. 10 tablet 0  . ibuprofen (ADVIL,MOTRIN) 200 MG tablet Take 400 mg by mouth every 6 (six) hours as needed (back pain).     . magnesium gluconate (MAGONATE) 30 MG tablet Take 1 tablet (30 mg total) by mouth daily. 30 tablet 0  . Maltodextrin-Xanthan Gum (RESOURCE THICKENUP CLEAR) POWD Dysphagia 3 (Mech soft);Nectar-thick liquid   Liquid Administration via: Cup;Straw Medication Administration: Whole meds  with puree Supervision: Staff to assist with self feeding;Full supervision/cueing for compensatory strategies Compensations: Minimize environmental distractions;Slow rate;Small sips/bites Postural Changes: Seated upright at 90 degrees;Remain upright for at least 30 minutes after po intake (Patient taking differently: Take by mouth See admin instructions. Dysphagia 3 (Mech soft);Nectar-thick liquid   Liquid Administration via: Cup;Straw Medication Administration: Whole meds with puree Supervision: Staff to assist with self feeding;Full supervision/cueing for compensatory strategies Compensations: Minimize environmental distractions;Slow rate;Small sips/bites Postural Changes: Seated upright at 90 degrees;Remain upright for at least 30 minutes after po intake) 10 Can 0  . potassium chloride SA (K-DUR,KLOR-CON) 20 MEQ tablet Take 1 tablet (20 mEq total) by mouth every Monday, Wednesday, and Friday. 30 tablet 0  . senna-docusate (SENOKOT-S) 8.6-50 MG tablet Take 1 tablet by mouth at bedtime. (Patient taking differently: Take 1 tablet by mouth at bedtime as needed (constipation). ) 30 tablet 0     Discharge Medications: Please see discharge summary for a list of discharge medications.  Relevant Imaging Results:  Relevant Lab Results:   Additional Information [id number]  Robb MatarKierra S Myrick Mcnairy, LCSWA

## 2018-01-15 NOTE — ED Notes (Signed)
Pt repeatedly removed her clothing and would not follow requests to keep clothing on. Bedding was changed and pt was placed in scrub pants, gown, and adult diaper. She still continued to remove her clothing and a request was made to keep her in a rm since she would not follow commands. Pt moved to rm 22.

## 2018-01-15 NOTE — ED Notes (Signed)
Dinner tray ordered.

## 2018-01-15 NOTE — ED Notes (Signed)
Kendrick Ranchim Kioni Stahl, patient's neighbor and pastor came by to visit with patient after [facility] and told she was not there; Mr.Erbie Arment visited with patient about 15 minutes and gave call back # in case he is needed-[phone]-Monique,RN

## 2018-01-15 NOTE — Evaluation (Signed)
Physical Therapy Evaluation Patient Details Name: Amanda Atkins MRN: [id number] DOB: [birth year] Today's Date: 01/15/2018   History of Present Illness  84 y.o. female admitted on 01/09/18 for SOB, hypoxemia.  Pt dx with acute hypoxic respiratory failure of unknown cause, hypokalemia/hypomagnesemia, PAF/A-fib with RVR.  Pt with other significant PMH of dementia, back injury, and A-fib brought in to hospital after being unable to progress in home environment with AMS.  Clinical Impression  Orders received for PT evaluation. Patient demonstrates deficits in functional mobility as indicated below. Will benefit from continued skilled PT to address deficits and maximize function. Will see as indicated and progress as tolerated.  Patient with limited participation due to lethargy. Unsafe to mobilize without moderate to maximial assist. Will need SNF.    Follow Up Recommendations SNF    Equipment Recommendations  None recommended by PT    Recommendations for Other Services       Precautions / Restrictions Precautions Precautions: Fall Precaution Comments: very unsteady on her feet and impulsive with poor safety awareness.       Mobility  Bed Mobility Overal bed mobility: Needs Assistance Bed Mobility: Rolling;Supine to Sit;Sit to Supine Rolling: Mod assist   Supine to sit: Max assist Sit to supine: Mod assist   General bed mobility comments: increased assist to arouse and perform multiple aspects of bed mobility. assist to elevate trunk to upright and rotate hips to EOB. Patient did initiate some pull to sit and repositioning. Elevate her LEs upon return to bed  Transfers Overall transfer level: Needs assistance   Transfers: Sit to/from Stand Sit to Stand: Mod assist         General transfer comment: performed face to face x2 with gait belt and chuck pad to power up  Ambulation/Gait Ambulation/Gait assistance: Max assist Gait Distance (Feet): 2 Feet   Gait  Pattern/deviations: Step-to pattern;Shuffle     General Gait Details: max assist to initiate side steps toward [facility]  Stairs            Wheelchair Mobility    Modified Rankin (Stroke Patients Only)       Balance Overall balance assessment: Needs assistance Sitting-balance support: Feet supported;No upper extremity supported;Bilateral upper extremity supported Sitting balance-Leahy Scale: Fair Sitting balance - Comments: able to sit EOB self supported   Standing balance support: Single extremity supported;No upper extremity supported Standing balance-Leahy Scale: Poor Standing balance comment: needs external assist for balance in standing.                              Pertinent Vitals/Pain      Home Living Family/patient expects to be discharged to:: Private residence Living Arrangements: Alone Available Help at Discharge: Personal care attendant;Available 24 hours/day(pt has 4 caregivers that rotate shifts throughout the day) Type of Home: House         Home Equipment: Walker - standard Additional Comments: Pt also has an aide    Prior Function Level of Independence: Needs assistance   Gait / Transfers Assistance Needed: supervision for all tasks, but usually walks independently per caregiver.            Hand Dominance        Extremity/Trunk Assessment   Upper Extremity Assessment Upper Extremity Assessment: Generalized weakness    Lower Extremity Assessment Lower Extremity Assessment: Generalized weakness    Cervical / Trunk Assessment Cervical / Trunk Assessment: Kyphotic  Communication  Cognition Arousal/Alertness: Lethargic Behavior During Therapy: Flat affect Overall Cognitive Status: History of cognitive impairments - at baseline                                 General Comments: h/o dementia      General Comments      Exercises     Assessment/Plan    PT Assessment Patient needs continued PT  services  PT Problem List Decreased strength;Decreased activity tolerance;Decreased balance;Decreased mobility;Decreased coordination;Decreased cognition;Decreased knowledge of use of DME;Decreased safety awareness;Decreased knowledge of precautions       PT Treatment Interventions DME instruction;Gait training;Stair training;Functional mobility training;Therapeutic activities;Therapeutic exercise;Balance training;Cognitive remediation;Patient/family education    PT Goals (Current goals can be found in the Care Plan section)  Acute Rehab PT Goals PT Goal Formulation: Patient unable to participate in goal setting Time For Goal Achievement: 01/29/18 Potential to Achieve Goals: Fair    Frequency Min 2X/week   Barriers to discharge        Co-evaluation               AM-PAC PT "6 Clicks" Mobility  Outcome Measure Help needed turning from your back to your side while in a flat bed without using bedrails?: A Lot Help needed moving from lying on your back to sitting on the side of a flat bed without using bedrails?: A Lot Help needed moving to and from a bed to a chair (including a wheelchair)?: A Lot Help needed standing up from a chair using your arms (e.g., wheelchair or bedside chair)?: A Lot Help needed to walk in hospital room?: Total Help needed climbing 3-5 steps with a railing? : Total 6 Click Score: 10    End of Session Equipment Utilized During Treatment: Gait belt Activity Tolerance: Patient tolerated treatment well Patient left: in bed;with call bell/phone within reach;with family/visitor present;with nursing/sitter in room Nurse Communication: Mobility status PT Visit Diagnosis: Muscle weakness (generalized) (M62.81);Unsteadiness on feet (R26.81)    Time: 0950-1002 PT Time Calculation (min) (ACUTE ONLY): 12 min   Charges:   PT Evaluation $PT Eval Moderate Complexity: 1 Mod          Charlotte Crumbevon Cheryl Stabenow, PT DPT  Board Certified Neurologic Specialist Acute  Rehabilitation Services Pager [phone] Office [phone]   Fabio AsaDevon J Lorali Khamis 01/15/2018, 10:09 AM

## 2018-01-15 NOTE — ED Notes (Signed)
Pt paged. Stated they would reach out and find someone to come see pt today

## 2018-01-16 MED ORDER — ALPRAZOLAM 0.5 MG PO TABS
0.5000 mg | ORAL_TABLET | Freq: Once | ORAL | Status: AC
  Administered 2018-01-16: 0.5 mg via ORAL
  Filled 2018-01-16: qty 1

## 2018-01-16 MED ORDER — HALOPERIDOL LACTATE 5 MG/ML IJ SOLN
5.0000 mg | Freq: Once | INTRAMUSCULAR | Status: AC
  Administered 2018-01-16: 5 mg via INTRAMUSCULAR
  Filled 2018-01-16: qty 1

## 2018-01-16 NOTE — ED Notes (Signed)
Pt noted to be restless - removing gown and attempting to get out of bed. Bed alarm on.

## 2018-01-16 NOTE — ED Notes (Signed)
Pt would not lie still while VS's were being taken.

## 2018-01-16 NOTE — ED Notes (Addendum)
Pt continues to be restless - Dr Anitra LauthPlunkett aware Caregiver advised they give pt Xanax 0.5mg  at home. Order received and given w/ice cream.

## 2018-01-16 NOTE — ED Notes (Signed)
Pt noted to be restless - continuously attempting to get out of bed - Caregiver at bedside. Female friend also at bedside. Haldol given as requested. Pt tolerated well.

## 2018-01-16 NOTE — ED Notes (Signed)
Pt took Augmentin in apple sauce.

## 2018-01-16 NOTE — ED Notes (Signed)
Breakfast tray delivered to pt. Pt noted to be sleeping at this time. Pt has been moving about on bed - opening her eyes intermittently then returning to sleep. Pt noted w/gown lying over her d/t she had removed it.

## 2018-01-16 NOTE — ED Notes (Signed)
Assisted pt to more comfortable position so may be fed by Caregiver per Caregiver's request.

## 2018-01-16 NOTE — ED Notes (Signed)
Caregiver at bedside. Pt noted to be restless. Pt changed into blue paper shirt from gown d/t pt continuously removing gown.

## 2018-01-16 NOTE — ED Notes (Signed)
Kendrick Ranchim Davis, pt's neighbor would like to be called should pt become restless.  His number is [phone].

## 2018-01-16 NOTE — ED Notes (Signed)
Per SW, SNF has not heard back from insurance as of yet.

## 2018-01-16 NOTE — ED Notes (Signed)
Assisted pt in bed for easier position to be able to eat - being fed w/Nancy, Caregiver. Pt takes small bites - refuses to open mouth fully.

## 2018-01-16 NOTE — ED Notes (Signed)
Pt attempting to get up out of bed - Caregiver at bedside - encouraging pt to remain in bed.

## 2018-01-16 NOTE — ED Notes (Signed)
Pt has used bedpan x 2 thus far this shift - voided.

## 2018-01-16 NOTE — ED Notes (Signed)
Tried to get patient to eat. Patient will not open mouth to place food.

## 2018-01-16 NOTE — ED Notes (Signed)
Pt's Caregiver has now left. Pt lying on bed - attempting to get up intermittently.

## 2018-01-17 MED ORDER — LORAZEPAM 2 MG/ML IJ SOLN
1.0000 mg | Freq: Once | INTRAMUSCULAR | Status: DC
  Filled 2018-01-17: qty 1

## 2018-01-17 NOTE — ED Notes (Signed)
Report called to Rodney Boozeasha at [facility]

## 2018-01-17 NOTE — ED Notes (Signed)
Caretaker at bedside assisting with breakfast

## 2018-01-17 NOTE — ED Provider Notes (Signed)
84 year old female here waiting outpatient disposition.  No acute changes in medical care at this time.  Patient sleeping in exam bed in no acute distress.  Please see previous providers note for full H&P.  Vitals:   01/17/18 0000 01/17/18 0623  BP: (!) 144/90 104/69  Pulse: 66 83  Resp: 17 14  Temp:    SpO2: 98% 98%     Eyvonne MechanicHedges, Ouida Abeyta, PA-C 01/17/18 [phone]    Doug SouJacubowitz, Sam, MD 01/17/18 1806

## 2018-01-17 NOTE — ED Notes (Signed)
Pt. Reaching for bedpan when trying to place bedpan NT asked pt. If she had to use the bathroom she stated "yes, hurry up" pt. Placed on bedpan; NT stepped out waiting for pt. To finish

## 2018-01-17 NOTE — Progress Notes (Addendum)
11:35am-CSW spoke with French Anaracy and was informed that authorization is still pending at this time.   10:31am-CSW spoke with French Anaracy from [facility]-no authorization has been received at this time. CSW sought further details on whether pt's sitter would cause and issue with pt being admitted to facility. Per French Anaracy it will not. French Anaracy expressed that if pt has a Comptrollersitter then sitters are allowed however sitter should speak with nursing staff when pt is in need of anything. At this time plan remains for [facility] to [facility]. French Anaracy to follow up with CSW once Berkley Harveyauth has been received. RN, pt, and Sitter updated at bedside at this time.   CSW aware that pt is still waiting for [facility]. CSW aware that French Anaracy from [facility] been [facility]. CSW has followed up with French Anaracy to gather an update on pt's insurance auth at this time.     Claude MangesKierra S. Amogh Komatsu, MSW, LCSW-A Emergency Department Clinical Social Worker [phone]

## 2018-01-17 NOTE — ED Notes (Signed)
Woke pt and repositioned to attempt to get pt to eat, pt not interested in eating at this time, repositioned for sleep and lights turned back off

## 2018-01-17 NOTE — ED Notes (Signed)
[facility] CONTACTED FOR PT TRANSPORT TO [facility]

## 2018-01-17 NOTE — Progress Notes (Addendum)
CSW received phone call from [place] with [facility]. Per Kennith Centerracey, authorization for insurance payment for SNF has come through. Pt can be [facility] to [facility] tonight. Per Kennith Centerracey, room number has not been assigned yet.   Report number: [phone]  CSW faxed [facility]S to [facility]. CSW updated EDP and RN. Pt's family at bedside and updated.   Montine CircleKelsy Stoy Fenn, Silverio LayLCSWA [facility]  [phone]

## 2018-01-17 NOTE — Progress Notes (Signed)
[facility]. Per operator, [facility] had not faxed list over yet, therefore unable to check where pt is on the list for [facility] pick up.   Montine CircleKelsy Cheron Coryell, Silverio LayLCSWA [facility]  [phone]

## 2018-01-18 ENCOUNTER — Observation Stay (HOSPITAL_COMMUNITY): Payer: Medicare Other

## 2018-01-18 ENCOUNTER — Encounter (HOSPITAL_COMMUNITY): Payer: Self-pay

## 2018-01-18 ENCOUNTER — Inpatient Hospital Stay (HOSPITAL_COMMUNITY)
Admission: EM | Admit: 2018-01-18 | Discharge: 2018-01-25 | DRG: 193 | Disposition: A | Discharge status: Skilled Nursing Facility | Payer: Medicare Other | Attending: Internal Medicine | Admitting: Internal Medicine

## 2018-01-18 ENCOUNTER — Emergency Department (HOSPITAL_COMMUNITY): Payer: Medicare Other

## 2018-01-18 ENCOUNTER — Other Ambulatory Visit: Payer: Self-pay

## 2018-01-18 DIAGNOSIS — Z87891 Personal history of nicotine dependence: Secondary | ICD-10-CM

## 2018-01-18 DIAGNOSIS — J189 Pneumonia, unspecified organism: Principal | ICD-10-CM

## 2018-01-18 DIAGNOSIS — G9340 Encephalopathy, unspecified: Secondary | ICD-10-CM | POA: Diagnosis present

## 2018-01-18 DIAGNOSIS — F03918 Unspecified dementia, unspecified severity, with other behavioral disturbance: Secondary | ICD-10-CM | POA: Diagnosis present

## 2018-01-18 DIAGNOSIS — I480 Paroxysmal atrial fibrillation: Secondary | ICD-10-CM | POA: Diagnosis present

## 2018-01-18 DIAGNOSIS — J181 Lobar pneumonia, unspecified organism: Secondary | ICD-10-CM

## 2018-01-18 DIAGNOSIS — I951 Orthostatic hypotension: Secondary | ICD-10-CM | POA: Diagnosis present

## 2018-01-18 DIAGNOSIS — Z7189 Other specified counseling: Secondary | ICD-10-CM

## 2018-01-18 DIAGNOSIS — I4811 Longstanding persistent atrial fibrillation: Secondary | ICD-10-CM | POA: Diagnosis present

## 2018-01-18 DIAGNOSIS — Y95 Nosocomial condition: Secondary | ICD-10-CM | POA: Diagnosis present

## 2018-01-18 DIAGNOSIS — Z91048 Other nonmedicinal substance allergy status: Secondary | ICD-10-CM

## 2018-01-18 DIAGNOSIS — R4182 Altered mental status, unspecified: Secondary | ICD-10-CM

## 2018-01-18 DIAGNOSIS — R296 Repeated falls: Secondary | ICD-10-CM | POA: Diagnosis present

## 2018-01-18 DIAGNOSIS — Z515 Encounter for palliative care: Secondary | ICD-10-CM | POA: Diagnosis present

## 2018-01-18 DIAGNOSIS — G92 Toxic encephalopathy: Secondary | ICD-10-CM | POA: Diagnosis present

## 2018-01-18 DIAGNOSIS — Z888 Allergy status to other drugs, medicaments and biological substances status: Secondary | ICD-10-CM

## 2018-01-18 DIAGNOSIS — Z79899 Other long term (current) drug therapy: Secondary | ICD-10-CM

## 2018-01-18 DIAGNOSIS — F0391 Unspecified dementia with behavioral disturbance: Secondary | ICD-10-CM | POA: Diagnosis present

## 2018-01-18 HISTORY — DX: Pneumonia, unspecified organism: J18.9

## 2018-01-18 LAB — URINALYSIS, ROUTINE W REFLEX MICROSCOPIC
BILIRUBIN URINE: NEGATIVE
Bacteria, UA: NONE SEEN
Glucose, UA: NEGATIVE mg/dL
Hgb urine dipstick: NEGATIVE
KETONES UR: NEGATIVE mg/dL
Leukocytes, UA: NEGATIVE
Nitrite: NEGATIVE
Protein, ur: 100 mg/dL — AB
Specific Gravity, Urine: 1.016 (ref 1.005–1.030)
pH: 6 (ref 5.0–8.0)

## 2018-01-18 LAB — COMPREHENSIVE METABOLIC PANEL
ALT: 24 U/L (ref 0–44)
AST: 33 U/L (ref 15–41)
Albumin: 3.5 g/dL (ref 3.5–5.0)
Alkaline Phosphatase: 77 U/L (ref 38–126)
Anion gap: 10 (ref 5–15)
BUN: 19 mg/dL (ref 8–23)
CO2: 26 mmol/L (ref 22–32)
Calcium: 9.6 mg/dL (ref 8.9–10.3)
Chloride: 105 mmol/L (ref 98–111)
Creatinine, Ser: 1.09 mg/dL — ABNORMAL HIGH (ref 0.44–1.00)
GFR calc Af Amer: 51 mL/min — ABNORMAL LOW (ref >60)
GFR calc non Af Amer: 44 mL/min — ABNORMAL LOW (ref >60)
Glucose, Bld: 153 mg/dL — ABNORMAL HIGH (ref 70–99)
Potassium: 4.1 mmol/L (ref 3.5–5.1)
Sodium: 141 mmol/L (ref 135–145)
TOTAL PROTEIN: 6.7 g/dL (ref 6.5–8.1)
Total Bilirubin: 0.8 mg/dL (ref 0.3–1.2)

## 2018-01-18 LAB — I-STAT VENOUS BLOOD GAS, ED
ACID-BASE EXCESS: 1 mmol/L (ref 0.0–2.0)
Bicarbonate: 28.2 mmol/L — ABNORMAL HIGH (ref 20.0–28.0)
O2 Saturation: 34 %
PH VEN: 7.323 (ref 7.250–7.430)
TCO2: 30 mmol/L (ref 22–32)
pCO2, Ven: 54.3 mmHg (ref 44.0–60.0)
pO2, Ven: 23 mmHg — CL (ref 32.0–45.0)

## 2018-01-18 LAB — CBC
HCT: 40.4 % (ref 36.0–46.0)
HEMOGLOBIN: 12.5 g/dL (ref 12.0–15.0)
MCH: 27.7 pg (ref 26.0–34.0)
MCHC: 30.9 g/dL (ref 30.0–36.0)
MCV: 89.4 fL (ref 80.0–100.0)
Platelets: 245 10*3/uL (ref 150–400)
RBC: 4.52 MIL/uL (ref 3.87–5.11)
RDW: 14.5 % (ref 11.5–15.5)
WBC: 10 10*3/uL (ref 4.0–10.5)
nRBC: 0 % (ref 0.0–0.2)

## 2018-01-18 LAB — I-STAT CG4 LACTIC ACID, ED
LACTIC ACID, VENOUS: 2.27 mmol/L — AB (ref 0.5–1.9)
Lactic Acid, Venous: 1.95 mmol/L — ABNORMAL HIGH (ref 0.5–1.9)

## 2018-01-18 LAB — BRAIN NATRIURETIC PEPTIDE: B Natriuretic Peptide: 161 pg/mL — ABNORMAL HIGH (ref 0.0–100.0)

## 2018-01-18 MED ORDER — DILTIAZEM HCL 25 MG/5ML IV SOLN
10.0000 mg | Freq: Four times a day (QID) | INTRAVENOUS | Status: DC
  Administered 2018-01-18 – 2018-01-19 (×5): 10 mg via INTRAVENOUS
  Filled 2018-01-18 (×7): qty 5

## 2018-01-18 MED ORDER — IOPAMIDOL (ISOVUE-370) INJECTION 76%
INTRAVENOUS | Status: AC
  Filled 2018-01-18: qty 100

## 2018-01-18 MED ORDER — IOPAMIDOL (ISOVUE-370) INJECTION 76%
100.0000 mL | Freq: Once | INTRAVENOUS | Status: AC | PRN
  Administered 2018-01-18: 36 mL via INTRAVENOUS

## 2018-01-18 MED ORDER — VANCOMYCIN HCL IN DEXTROSE 1-5 GM/200ML-% IV SOLN
1000.0000 mg | Freq: Once | INTRAVENOUS | Status: AC
  Administered 2018-01-18: 1000 mg via INTRAVENOUS
  Filled 2018-01-18: qty 200

## 2018-01-18 MED ORDER — SODIUM CHLORIDE 0.9 % IV SOLN
1.0000 g | Freq: Once | INTRAVENOUS | Status: AC
  Administered 2018-01-18: 1 g via INTRAVENOUS
  Filled 2018-01-18: qty 1

## 2018-01-18 MED ORDER — SODIUM CHLORIDE 0.9 % IV SOLN
1.0000 g | INTRAVENOUS | Status: DC
  Filled 2018-01-18: qty 1

## 2018-01-18 MED ORDER — HALOPERIDOL LACTATE 5 MG/ML IJ SOLN
2.0000 mg | Freq: Once | INTRAMUSCULAR | Status: AC | PRN
  Administered 2018-01-18: 2 mg via INTRAVENOUS
  Filled 2018-01-18: qty 1

## 2018-01-18 MED ORDER — SODIUM CHLORIDE 0.9 % IV BOLUS
500.0000 mL | Freq: Once | INTRAVENOUS | Status: AC
  Administered 2018-01-18: 500 mL via INTRAVENOUS

## 2018-01-18 MED ORDER — DILTIAZEM HCL 25 MG/5ML IV SOLN
10.0000 mg | Freq: Once | INTRAVENOUS | Status: AC
  Administered 2018-01-18: 10 mg via INTRAVENOUS
  Filled 2018-01-18: qty 5

## 2018-01-18 MED ORDER — LORAZEPAM 2 MG/ML IJ SOLN
0.5000 mg | Freq: Once | INTRAMUSCULAR | Status: AC
  Administered 2018-01-18: 0.5 mg via INTRAVENOUS
  Filled 2018-01-18: qty 1

## 2018-01-18 MED ORDER — ENOXAPARIN SODIUM 30 MG/0.3ML ~~LOC~~ SOLN
30.0000 mg | SUBCUTANEOUS | Status: DC
  Administered 2018-01-18 – 2018-01-25 (×8): 30 mg via SUBCUTANEOUS
  Filled 2018-01-18 (×9): qty 0.3

## 2018-01-18 MED ORDER — PIPERACILLIN-TAZOBACTAM 3.375 G IVPB
3.3750 g | Freq: Three times a day (TID) | INTRAVENOUS | Status: DC
  Administered 2018-01-18 – 2018-01-19 (×2): 3.375 g via INTRAVENOUS
  Filled 2018-01-18 (×4): qty 50

## 2018-01-18 MED ORDER — VANCOMYCIN HCL IN DEXTROSE 750-5 MG/150ML-% IV SOLN: 750.0000 mg | INTRAVENOUS | Status: DC

## 2018-01-18 MED ORDER — HALOPERIDOL LACTATE 5 MG/ML IJ SOLN: 2.0000 mg | Freq: Once | INTRAMUSCULAR | Status: DC

## 2018-01-18 MED ORDER — SODIUM CHLORIDE 0.9 % IV SOLN
INTRAVENOUS | Status: DC
  Administered 2018-01-18 – 2018-01-19 (×2): via INTRAVENOUS

## 2018-01-18 NOTE — ED Notes (Signed)
Patient transported to X-ray 

## 2018-01-18 NOTE — Plan of Care (Signed)
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
## 2018-01-18 NOTE — Care Plan (Signed)
Discussed case with Dr. Clarene DukeLittle patient accepted to a telemetry bed as observation. Ms. Amanda MorinFenwicke is a 84 y/o female with pmh of dementia and atrial fibrillation not on anticoagulation due to risk of falls; who presented with complaints of AMS.  Patient had recently been being treated as outpatient for pneumonia with doxycycline and switched to Augmentin during hospitalization from 11/24-11/26.  Patient returned to hospital on 11/29 with inability to walk and was held in the emergency department until [facility].  Patient had been noted to be walking around but then was found sitting in a chair unresponsive.  Brought back to the hospital found to have signs of new left sided infiltrate/atelectasis and initial lactic acid trending up to 2.27. Patient was started on empiric antibiotics of vancomycin and cefepime given recent hospitalization.  CT angiogram of the chest recommended for possible pulmonary emboli given prolonged ED stay prior to transport to [facility].

## 2018-01-18 NOTE — Progress Notes (Signed)
[facility]  Patient Status: Active (receiving services up to time of hospitalization)  Referred for services but returned to Hospital prior to start of care.  [facility] is providing the following services: RN, PT and ST  If patient discharges after hours, please call [phone].   Kizzie FurnishDonna Fellmy 01/18/2018, 4:54 PM
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
## 2018-01-18 NOTE — Progress Notes (Signed)
Pharmacy Antibiotic Note  Amanda Atkins is a 84 y.o. female admitted on 01/18/2018 with pneumonia.  Pharmacy has been consulted for vancomycin dosing.  Plan: Vancomycin 1gm IV x 1 then 750 mg IV q48 hours F/u renal function, cultures and clinical course     Temp (24hrs), Avg:98 F (36.7 C), Min:98 F (36.7 C), Max:98 F (36.7 C)  Recent Labs  Lab 01/14/18 1405 01/14/18 1617 01/18/18 0144 01/18/18 0206  WBC 5.2  --  10.0  --   CREATININE 0.85  --  1.09*  --   LATICACIDVEN  --  1.70  --  1.95*    Estimated Creatinine Clearance: 22.9 mL/min (A) (by C-G formula based on SCr of 1.09 mg/dL (H)).    Allergies  Allergen Reactions  . Gluten Meal Diarrhea  . Olanzapine Other (See Comments)    Anxious, irritable, wild looking eyes     Thank you for allowing pharmacy to be a part of this patient's care.  Talbert CageSeay, Marina Desire Poteet 01/18/2018 4:27 AM

## 2018-01-18 NOTE — Progress Notes (Signed)
Pharmacy Antibiotic Note  Amanda Atkins is a 84 y.o. female admitted on 01/18/2018 with pneumonia.   Vancomycin 1gm IV given at 5am and Cefepime 1 gm IV given at ~7am today.  Cefepime now changing to Zosyn for aspiration pneumonia coverage.  Plan:  Zosyn 3.375 gm IV q8hrs (each over 4 hrs)  Continue Vancomycin 750 mg IV q48hrs - next dose due on 12/5  Follow renal function, culture data, clinical progress and antibiotic plans.  Height: 4\' 11"  (149.9 cm) Weight: 104 lb 15 oz (47.6 kg) IBW/kg (Calculated) : 43.2  Temp (24hrs), Avg:98.1 F (36.7 C), Min:97.7 F (36.5 C), Max:98.3 F (36.8 C)  Recent Labs  Lab 01/14/18 1405 01/14/18 1617 01/18/18 0144 01/18/18 0206 01/18/18 0457  WBC 5.2  --  10.0  --   --   CREATININE 0.85  --  1.09*  --   --   LATICACIDVEN  --  1.70  --  1.95* 2.27*    Estimated Creatinine Clearance: 22.9 mL/min (A) (by C-G formula based on SCr of 1.09 mg/dL (H)).    Allergies  Allergen Reactions  . Gluten Meal Diarrhea  . Olanzapine Other (See Comments)    Anxious, irritable, wild looking eyes    Antimicrobials this admission:  Vancomycin 12/3>>  Cefepime x 1 dose on 12/3  Zosyn 12/3>>  Microbiology results:  12/3 blood x 2 -  12/3 sputum -  12/3 urine -  Thank you for allowing pharmacy to be a part of this patient's care.  Dennie Fettersgan, Dvid Pendry [place], [place] Pager: [phone] 01/18/2018 5:51 PM
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
## 2018-01-19 ENCOUNTER — Observation Stay (HOSPITAL_COMMUNITY): Payer: Medicare Other

## 2018-01-19 DIAGNOSIS — G9340 Encephalopathy, unspecified: Secondary | ICD-10-CM | POA: Diagnosis not present

## 2018-01-19 LAB — BASIC METABOLIC PANEL
Anion gap: 13 (ref 5–15)
BUN: 11 mg/dL (ref 8–23)
CO2: 21 mmol/L — ABNORMAL LOW (ref 22–32)
CREATININE: 0.97 mg/dL (ref 0.44–1.00)
Calcium: 9 mg/dL (ref 8.9–10.3)
Chloride: 107 mmol/L (ref 98–111)
GFR calc Af Amer: 59 mL/min — ABNORMAL LOW (ref >60)
GFR calc non Af Amer: 51 mL/min — ABNORMAL LOW (ref >60)
Glucose, Bld: 105 mg/dL — ABNORMAL HIGH (ref 70–99)
Potassium: 3.8 mmol/L (ref 3.5–5.1)
Sodium: 141 mmol/L (ref 135–145)

## 2018-01-19 LAB — CBC WITH DIFFERENTIAL/PLATELET
Abs Immature Granulocytes: 0.02 10*3/uL (ref 0.00–0.07)
Basophils Absolute: 0.1 10*3/uL (ref 0.0–0.1)
Basophils Relative: 2 %
Eosinophils Absolute: 0.4 10*3/uL (ref 0.0–0.5)
Eosinophils Relative: 6 %
HCT: 36.2 % (ref 36.0–46.0)
Hemoglobin: 11.6 g/dL — ABNORMAL LOW (ref 12.0–15.0)
IMMATURE GRANULOCYTES: 0 %
Lymphocytes Relative: 19 %
Lymphs Abs: 1.2 10*3/uL (ref 0.7–4.0)
MCH: 28.2 pg (ref 26.0–34.0)
MCHC: 32 g/dL (ref 30.0–36.0)
MCV: 88.1 fL (ref 80.0–100.0)
MONOS PCT: 16 %
Monocytes Absolute: 0.9 10*3/uL (ref 0.1–1.0)
Neutro Abs: 3.5 10*3/uL (ref 1.7–7.7)
Neutrophils Relative %: 57 %
Platelets: 269 10*3/uL (ref 150–400)
RBC: 4.11 MIL/uL (ref 3.87–5.11)
RDW: 14.5 % (ref 11.5–15.5)
WBC: 6.1 10*3/uL (ref 4.0–10.5)
nRBC: 0 % (ref 0.0–0.2)

## 2018-01-19 LAB — T4, FREE: Free T4: 1.38 ng/dL (ref 0.82–1.77)

## 2018-01-19 LAB — CULTURE, BLOOD (ROUTINE X 2)
CULTURE: NO GROWTH
Culture: NO GROWTH
SPECIAL REQUESTS: ADEQUATE

## 2018-01-19 LAB — URINE CULTURE: Culture: NO GROWTH

## 2018-01-19 LAB — AMMONIA: Ammonia: 33 umol/L (ref 9–35)

## 2018-01-19 LAB — TSH: TSH: 0.454 u[IU]/mL (ref 0.350–4.500)

## 2018-01-19 LAB — VITAMIN B12: Vitamin B-12: 1194 pg/mL — ABNORMAL HIGH (ref 180–914)

## 2018-01-19 MED ORDER — SENNOSIDES-DOCUSATE SODIUM 8.6-50 MG PO TABS
1.0000 | ORAL_TABLET | Freq: Every day | ORAL | Status: DC
  Administered 2018-01-19 – 2018-01-24 (×6): 1 via ORAL
  Filled 2018-01-19 (×6): qty 1

## 2018-01-19 MED ORDER — DILTIAZEM HCL ER COATED BEADS 180 MG PO CP24
180.0000 mg | ORAL_CAPSULE | Freq: Every day | ORAL | Status: DC
  Filled 2018-01-19: qty 1

## 2018-01-19 MED ORDER — LORAZEPAM 2 MG/ML IJ SOLN: 0.5000 mg | Freq: Once | INTRAMUSCULAR | Status: DC

## 2018-01-19 MED ORDER — DILTIAZEM HCL 60 MG PO TABS
60.0000 mg | ORAL_TABLET | Freq: Three times a day (TID) | ORAL | Status: DC
  Administered 2018-01-19 – 2018-01-20 (×3): 60 mg via ORAL
  Filled 2018-01-19 (×3): qty 1

## 2018-01-19 MED ORDER — FUROSEMIDE 20 MG PO TABS
20.0000 mg | ORAL_TABLET | ORAL | Status: DC
  Administered 2018-01-19: 20 mg via ORAL
  Filled 2018-01-19: qty 1

## 2018-01-19 NOTE — Progress Notes (Signed)
[facility] Progress Note  Patient: Amanda Atkins WNU:[id number]   PCP: Margaree Mackintosh, MD DOB: 01/05/1925   DOA: 01/18/2018   DOS: 01/19/2018   Date of Service: the patient was seen and examined on 01/19/2018  Brief hospital course: Pt. with PMH of dementia, A. fib; admitted on 01/18/2018, presented with complaint of confusion, was found to have acute encephalopathy. Currently further plan is further work-up for an encephalopathy.  Subjective: Early in the morning the patient was unresponsive, not following any commands.  Unable to verbalize anything as well.  Later in the afternoon patient was awake still lethargic but able to follow commands.  Communicating minimally.  Assessment and Plan: 1.  Acute encephalopathy. Etiology unclear. Presents with an episode of unresponsiveness at the facility. Initial concern was for sepsis infection although I do not think that the patient actually has an active infection. Urine culture is unremarkable, chest x-ray unremarkable. Clinically no evidence of infection as well. CTA negative for PE. MRI brain negative for any acute abnormality. Metabolic work-up is also negative. EEG currently pending. Orthostatic hypotension cannot be ruled out in the setting of patient having PAF as well as dementia. For now we will continue to monitor overnight. PT OT consulted.  2.  Dementia. Appears moderate to severe. Monitor for now.  3.  Paroxysmal A. fib. Patient is unable to take Cardizem sustained-release. We will transition her to Cardizem tablet. Monitor. Not on antiregulation due to recurrent falls.  4.  Goals of care discussion. Family wants to maintain full code for now. We will discuss with them tomorrow depending on the work-up.  Diet: dysphagia 2 DVT Prophylaxis: subcutaneous Heparin  Advance goals of care discussion: full code  Family Communication: family was present at bedside, at the time of interview. The pt provided  permission to discuss medical plan with the family. Opportunity was given to ask question and all questions were answered satisfactorily.   Disposition:  Discharge to SNF.  Consultants: none Procedures: noen  Scheduled Meds: . diltiazem  60 mg Oral Q8H  . enoxaparin (LOVENOX) injection  30 mg Subcutaneous Q24H  . senna-docusate  1 tablet Oral QHS   Continuous Infusions: PRN Meds:  Antibiotics: Anti-infectives (From admission, onward)   Start     Dose/Rate Route Frequency Ordered Stop   01/20/18 0400  vancomycin (VANCOCIN) IVPB 750 mg/150 ml premix  Status:  Discontinued     750 mg 150 mL/hr over 60 Minutes Intravenous Every 48 hours 01/18/18 0426 01/19/18 1045   01/19/18 0600  ceFEPIme (MAXIPIME) 1 g in sodium chloride 0.9 % 100 mL IVPB  Status:  Discontinued     1 g 200 mL/hr over 30 Minutes Intravenous Every 24 hours 01/18/18 0802 01/18/18 1741   01/18/18 1830  piperacillin-tazobactam (ZOSYN) IVPB 3.375 g  Status:  Discontinued     3.375 g 12.5 mL/hr over 240 Minutes Intravenous Every 8 hours 01/18/18 1749 01/19/18 1045   01/18/18 0415  vancomycin (VANCOCIN) IVPB 1000 mg/200 mL premix     1,000 mg 200 mL/hr over 60 Minutes Intravenous  Once 01/18/18 0404 01/18/18 0600   01/18/18 0400  ceFEPIme (MAXIPIME) 1 g in sodium chloride 0.9 % 100 mL IVPB     1 g 200 mL/hr over 30 Minutes Intravenous  Once 01/18/18 0355 01/18/18 0745       Objective: Physical Exam: Vitals:   01/18/18 2350 01/19/18 0228 01/19/18 0859 01/19/18 0925  BP: 113/66 107/60 (!) 155/131 (!) 154/94  Pulse: 87  (!) 152  Resp: 18     Temp: (!) 97.3 F (36.3 C)   (!) 97.1 F (36.2 C)  TempSrc: Axillary   Axillary  SpO2: 93% 95% 98%   Weight:      Height:        Intake/Output Summary (Last 24 hours) at 01/19/2018 1933 Last data filed at 01/19/2018 1630 Gross per 24 hour  Intake 872.87 ml  Output -  Net 872.87 ml   Filed Weights   01/18/18 1419  Weight: 47.6 kg   General: Alert, Awake and  Oriented to Time, Place and Person. Appear in mild distress, affect appropriate Eyes: PERRL, Conjunctiva normal ENT: Oral Mucosa clear moist. Neck: no JVD, no Abnormal Mass Or lumps Cardiovascular: S1 and S2 Present, ap  Murmur, Peripheral Pulses Present Respiratory: normal respiratory effort, Bilateral Air entry equal and Decreased, no use of accessory muscle, Clear to Auscultation, no Crackles, no wheezes Abdomen: Bowel Sound present, Soft and no tenderness, no hernia Skin: no redness, no Rash, no induration Extremities: no Pedal edema, no calf tenderness Neurologic: Grossly no focal neuro deficit. Bilaterally Equal motor strength  Data Reviewed: CBC: Recent Labs  Lab 01/14/18 1405 01/18/18 0144 01/19/18 0540  WBC 5.2 10.0 6.1  NEUTROABS 2.8  --  3.5  HGB 11.7* 12.5 11.6*  HCT 38.8 40.4 36.2  MCV 93.9 89.4 88.1  PLT 242 245 269   Basic Metabolic Panel: Recent Labs  Lab 01/14/18 1405 01/18/18 0144 01/19/18 0540  NA 140 141 141  K 4.0 4.1 3.8  CL 106 105 107  CO2 26 26 21*  GLUCOSE 153* 153* 105*  BUN 19 19 11   CREATININE 0.85 1.09* 0.97  CALCIUM 9.3 9.6 9.0    Liver Function Tests: Recent Labs  Lab 01/14/18 1405 01/18/18 0144  AST 31 33  ALT 21 24  ALKPHOS 77 77  BILITOT 0.6 0.8  PROT 6.5 6.7  ALBUMIN 3.8 3.5   No results for input(s): LIPASE, AMYLASE in the last 168 hours. Recent Labs  Lab 01/19/18 1124  AMMONIA 33   Coagulation Profile: No results for input(s): INR, PROTIME in the last 168 hours. Cardiac Enzymes: No results for input(s): CKTOTAL, CKMB, CKMBINDEX, TROPONINI in the last 168 hours. BNP (last 3 results) No results for input(s): PROBNP in the last 8760 hours. CBG: No results for input(s): GLUCAP in the last 168 hours. Studies: Ct Angio Chest Pe W/cm &/or Wo Cm  Result Date: 01/18/2018 CLINICAL DATA:  84 year old female with acute shortness of breath. EXAM: CT ANGIOGRAPHY CHEST WITH CONTRAST TECHNIQUE: Multidetector CT imaging of the  chest was performed using the standard protocol during bolus administration of intravenous contrast. Multiplanar CT image reconstructions and MIPs were obtained to evaluate the vascular anatomy. CONTRAST:  36mL ISOVUE-370 IOPAMIDOL (ISOVUE-370) INJECTION 76% COMPARISON:  01/18/2018 and prior chest radiographs FINDINGS: Cardiovascular: This is a technically adequate study. Respiratory motion artifact slightly decreases sensitivity in the LOWER lungs. No pulmonary emboli are identified. Cardiomegaly and small pericardial effusion noted. Coronary artery and aortic atherosclerotic calcifications are identified. Ectatic ascending thoracic aorta noted without evidence of aneurysm. Mediastinum/Nodes: No enlarged mediastinal, hilar, or axillary lymph nodes. Thyroid gland, trachea, and esophagus demonstrate no significant findings. Lungs/Pleura: Central ground-glass opacities and mild bilateral interstitial/interlobular septal thickening noted which suggests edema. Trace bilateral pleural effusions are noted. No evidence of definite airspace disease, consolidation, mass or pneumothorax. Upper Abdomen: No acute abnormality Musculoskeletal: No acute or suspicious bony abnormalities. Thoracic compression fractures are unchanged from prior radiographs Review of the  MIP images confirms the above findings. IMPRESSION: 1. No evidence of pulmonary emboli. 2. Central ground-glass opacities and mild bilateral interstitial/interlobular septal thickening, nonspecific but suggestive of edema. Trace bilateral pleural effusions and small pericardial effusion. 3. Cardiomegaly and coronary artery disease. 4.  Aortic Atherosclerosis (ICD10-I70.0). Electronically Signed   By: Harmon Pier M.D.   On: 01/18/2018 20:08   Mr Brain Wo Contrast  Result Date: 01/19/2018 CLINICAL DATA:  84 y/o  F; altered mental status. EXAM: MRI HEAD WITHOUT CONTRAST TECHNIQUE: Axial DWI and axial T2 FLAIR propeller sequences were acquired. Patient was unable to  continue additional sequences were not acquired. COMPARISON:  01/14/2018 CT head. FINDINGS: Moderate motion artifact. No reduced diffusion to suggest acute or early subacute infarction. No extra-axial collection, hydrocephalus, mass effect, or herniation identified the diffusion and FLAIR sequences. Patchy nonspecific T2 FLAIR hyperintensities in subcortical and periventricular white matter are compatible with moderate chronic microvascular ischemic changes for age. Moderate volume loss of the brain. Mucous retention cyst in right posterior ethmoid air cells. Left mastoid air cell opacification. IMPRESSION: 1. Axial DWI and axial T2 FLAIR sequences were acquired. Moderate motion artifact. 2. No acute stroke or mass effect. 3. Moderate chronic microvascular ischemic changes and volume loss of the brain. Electronically Signed   By: Mitzi Hansen M.D.   On: 01/19/2018 13:38     Time spent: 35 minutes  Author: Lynden Oxford, MD [facility] Pager: [phone] 01/19/2018 7:33 PM  Between 7PM-7AM, please contact night-coverage at [email or web address], password [facility]

## 2018-01-19 NOTE — Progress Notes (Signed)
EEG completed. Results pending

## 2018-01-19 NOTE — Clinical Social Work Note (Signed)
Clinical Social Work Assessment  Patient Details  Name: Amanda Atkins MRN: [id number] Date of Birth: 08/15/1924  Date of referral:  01/19/18               Reason for consult:  Facility Placement                Permission sought to share information with:  Family Supports Permission granted to share information::     Name::     Counselling psychologisthonda  Agency::  [facility]  Relationship::  daughter  Contact Information:     Housing/Transportation Living arrangements for the past 2 months:  [facility] Source of Information:  Adult Children Patient Interpreter Needed:  None Criminal Activity/Legal Involvement Pertinent to Current Situation/Hospitalization:  No - Comment as needed Significant Relationships:  Adult Children Lives with:  Facility Resident Do you feel safe going back to the place where you live?  Yes Need for family participation in patient care:  No (Coment)  Care giving concerns:  Pt is disoriented x4. CSW spoke with pt's daughter via telephone.   Social Worker assessment / plan:  CSW spoke with pt's daughter via telephone. Pt was at [facility] however, [facility] not agree[facility]. [name] asked CSW to see if [facility]. CSW to follow up with facility and pt's daughter.  Employment status:  Retired Database administratornsurance information:  Managed Medicare PT Recommendations:  Skilled Nursing Facility Information / Referral to community resources:  Skilled Nursing Facility  Patient/Family's Response to care:  [name] verbalized understanding of CSW role and expressed appreciation for support. [name] denies any concern regarding pt care at this time.   Patient/Family's Understanding of and Emotional Response to Diagnosis, Current Treatment, and Prognosis:  [name] understanding and realistic regarding pt's physical limitations. [name] understands the need for pt to go to SNF at d/c.  [name] denies any concern regarding  pt's treatment plan at this time. CSW will continue to provide support and facilitate d/c needs.   Emotional Assessment Appearance:  Appears stated age Attitude/Demeanor/Rapport:  Unable to Assess Affect (typically observed):  Unable to Assess Orientation:  (disorriented x4) Alcohol / Substance use:  Not Applicable Psych involvement (Current and /or in the community):  No (Comment)  Discharge Needs  Concerns to be addressed:  Basic Needs, Care Coordination Readmission within the last 30 days:  Yes Current discharge risk:  Dependent with Mobility Barriers to Discharge:  Continued Medical Work up   Pacific MutualBridget A Marcy Sookdeo, LCSW 01/19/2018, 3:01 PM

## 2018-01-19 NOTE — NC FL2 (Addendum)
  [place] MEDICAID FL2 LEVEL OF CARE SCREENING TOOL     IDENTIFICATION  Patient Name: Amanda Atkins Birthdate: November 17, 1924 Sex: female Admission Date (Current Location): 01/18/2018  [facility] [place]   Facility and Address:  [facility], [address], [place], [place] [address]      Provider Number: [id number]  Attending Physician Name and Address:  Rolly SalterPatel, Pranav M, MD  Relative Name and Phone Number:       Current Level of Care: Hospital Recommended Level of Care: Skilled Nursing Facility Prior Approval Number:    Date Approved/Denied:   PASRR Number: [id number]  Discharge Plan: SNF    Current Diagnoses: Patient Active Problem List   Diagnosis Date Noted  . Acute encephalopathy 01/18/2018  . Acute hypoxemic respiratory failure (HCC) 01/09/2018  . Dementia with behavioral disturbance (HCC) 12/18/2017  . Memory loss 09/15/2017  . PAF (paroxysmal atrial fibrillation) (HCC) 11/29/2014    Orientation RESPIRATION BLADDER Height & Weight     (disorriented x4)  Normal Incontinent, External catheter(placed 01/18/18) Weight: 104 lb 15 oz (47.6 kg) Height:  4\' 11"  (149.9 cm)  BEHAVIORAL SYMPTOMS/MOOD NEUROLOGICAL BOWEL NUTRITION STATUS      Incontinent Diet(DYS 2 diet, thin liquids)  AMBULATORY STATUS COMMUNICATION OF NEEDS Skin   Extensive Assist Verbally Normal                       Personal Care Assistance Level of Assistance  Dressing, Feeding, Bathing Bathing Assistance: Maximum assistance Feeding assistance: Limited assistance Dressing Assistance: Maximum assistance     Functional Limitations Info  Sight, Hearing, Speech Sight Info: Adequate Hearing Info: Adequate Speech Info: Adequate    SPECIAL CARE FACTORS FREQUENCY  PT (By licensed PT), OT (By licensed OT)                    Contractures Contractures Info: Not present    Additional Factors Info  Code Status, Allergies Code Status  Info: Full Code Allergies Info: Gluten Meal, Olanzapine           Current Medications (01/19/2018):  This is the current hospital active medication list Current Facility-Administered Medications  Medication Dose Route Frequency Provider Last Rate Last Dose  . diltiazem (CARDIZEM CD) 24 hr capsule 180 mg  180 mg Oral Daily Rolly SalterPatel, Pranav M, MD      . enoxaparin (LOVENOX) injection 30 mg  30 mg Subcutaneous Q24H Jonah BlueYates, Jennifer, MD   30 mg at 01/19/18 0932  . furosemide (LASIX) tablet 20 mg  20 mg Oral Q M,W,F Rolly SalterPatel, Pranav M, MD   20 mg at 01/19/18 1403  . senna-docusate (Senokot-S) tablet 1 tablet  1 tablet Oral QHS Rolly SalterPatel, Pranav M, MD         Discharge Medications: Please see discharge summary for a list of discharge medications.  Relevant Imaging Results:  Relevant Lab Results:   Additional Information [id number]  Maree KrabbeBridget A Khaleel Beckom, LCSW

## 2018-01-20 DIAGNOSIS — G9340 Encephalopathy, unspecified: Secondary | ICD-10-CM | POA: Diagnosis not present

## 2018-01-20 DIAGNOSIS — Z515 Encounter for palliative care: Secondary | ICD-10-CM | POA: Diagnosis not present

## 2018-01-20 DIAGNOSIS — J189 Pneumonia, unspecified organism: Secondary | ICD-10-CM | POA: Diagnosis present

## 2018-01-20 DIAGNOSIS — Z888 Allergy status to other drugs, medicaments and biological substances status: Secondary | ICD-10-CM | POA: Diagnosis not present

## 2018-01-20 DIAGNOSIS — Z79899 Other long term (current) drug therapy: Secondary | ICD-10-CM | POA: Diagnosis not present

## 2018-01-20 DIAGNOSIS — I495 Sick sinus syndrome: Secondary | ICD-10-CM | POA: Diagnosis not present

## 2018-01-20 DIAGNOSIS — Y95 Nosocomial condition: Secondary | ICD-10-CM | POA: Diagnosis present

## 2018-01-20 DIAGNOSIS — I4821 Permanent atrial fibrillation: Secondary | ICD-10-CM | POA: Diagnosis not present

## 2018-01-20 DIAGNOSIS — Z87891 Personal history of nicotine dependence: Secondary | ICD-10-CM | POA: Diagnosis not present

## 2018-01-20 DIAGNOSIS — Z7189 Other specified counseling: Secondary | ICD-10-CM | POA: Diagnosis not present

## 2018-01-20 DIAGNOSIS — Z91048 Other nonmedicinal substance allergy status: Secondary | ICD-10-CM | POA: Diagnosis not present

## 2018-01-20 DIAGNOSIS — F0391 Unspecified dementia with behavioral disturbance: Secondary | ICD-10-CM | POA: Diagnosis present

## 2018-01-20 DIAGNOSIS — R4182 Altered mental status, unspecified: Secondary | ICD-10-CM | POA: Diagnosis present

## 2018-01-20 DIAGNOSIS — I4811 Longstanding persistent atrial fibrillation: Secondary | ICD-10-CM | POA: Diagnosis present

## 2018-01-20 DIAGNOSIS — R296 Repeated falls: Secondary | ICD-10-CM | POA: Diagnosis present

## 2018-01-20 DIAGNOSIS — I951 Orthostatic hypotension: Secondary | ICD-10-CM | POA: Diagnosis present

## 2018-01-20 DIAGNOSIS — G92 Toxic encephalopathy: Secondary | ICD-10-CM | POA: Diagnosis present

## 2018-01-20 LAB — MAGNESIUM: Magnesium: 1.9 mg/dL (ref 1.7–2.4)
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
## 2018-01-20 NOTE — Progress Notes (Signed)
  Speech Language Pathology Treatment: Dysphagia  Patient Details Name: Amanda Atkins MRN: [id number] DOB: 09-Nov-1924 Today's Date: 01/20/2018 Time: 7871-8367 SLP Time Calculation (min) (ACUTE ONLY): 10 min  Assessment / Plan / Recommendation Clinical Impression  Pt appears to be more confused today; declined to eat much breakfast per sitter.  Does not demonstrate overt concerns for aspiration today.  There is adequate anticipation of POs, mastication, no coughing with POs, no oral holding.  Recommend resuming dysphagia 3 diet, thin liquids; give meds whole in applesauce.  No further SLP f/u needed. Our service will sign off.   HPI HPI: [name] w/ PMH including A fib, dementia who presented with AMS. CXR 12/3 reveals Left lower lobe atelectasis or infiltrate/pneumonia. Patient was brought to ED on 11/29 for weakness and difficulty ambulating at home after recent hospitalization for A fib and pneumonia. She was medically cleared and was held in the ED until  evening of 12/2, when she was discharged to [facility].  Upon arriveal to [facility] place she became unresponsive and was brought back to [facility] ED. Pt's swallowing was evaluated by SLP on 11/25; noted to have a cognitively-based dysphagia, with intermittent oral holding; immediate coughing with thin liquids, and s/s suggestive of potential esophageal involvement. It was recommended to adjust diet to Dys 3 textures, nectar thick liquids.      SLP Plan  All goals met       Recommendations  Diet recommendations: Dysphagia 3 (mechanical soft);Thin liquid Liquids provided via: Cup;Straw Medication Administration: Whole meds with puree Supervision: Staff to assist with self feeding Compensations: Minimize environmental distractions                Oral Care Recommendations: Oral care BID Follow up Recommendations: Skilled Nursing facility SLP Visit Diagnosis: Dysphagia, unspecified (R13.10) Plan: All goals met       GO                 Amanda Atkins L. Tivis Ringer, [place] CCC/SLP Acute Rehabilitation Services Office number [phone] Pager [phone]  Amanda Atkins 01/20/2018, 11:46 AM

## 2018-01-20 NOTE — Evaluation (Signed)
Physical Therapy Evaluation Patient Details Name: Amanda Atkins MRN: [id number] DOB: 1924/07/19 Today's Date: 01/20/2018   History of Present Illness  Pt. with PMH of dementia, A. fib; admitted on 01/18/2018, presented with complaint of confusion, was found to have acute encephalopathy.    Clinical Impression  Pt admitted with above diagnosis. Pt currently with functional limitations due to the deficits listed below (see PT Problem List). History provided by caretaker in room, reports pt was living at home with 4 caregivers 24/7 support lately. She was ambulating around the home with supervision and no assistive device. Today her cognition is not at baseline as she is not able to follow cues or converse. Max A for all mobility and unsafe to attempt walking. Stood EOB briefly with poor balance. Per caretaker, family hopes patient will return to PLOF to return home with caregivers but acknowledge right now she is too dependent to return. Pt will benefit from skilled PT to increase their independence and safety with mobility to allow discharge to the venue listed below.       Follow Up Recommendations SNF    Equipment Recommendations  None recommended by PT    Recommendations for Other Services       Precautions / Restrictions Restrictions Weight Bearing Restrictions: No      Mobility  Bed Mobility Overal bed mobility: Needs Assistance Bed Mobility: Rolling;Supine to Sit;Sit to Supine Rolling: Mod assist   Supine to sit: Max assist Sit to supine: Mod assist   General bed mobility comments: mod to max A to assist up to sitting once sitting can balance herslef with contat gaurd  Transfers Overall transfer level: Needs assistance Equipment used: 1 person hand held assist Transfers: Sit to/from Stand Sit to Stand: Max assist         General transfer comment: max A to stand, unable to balance with zero stability, returned back to    Ambulation/Gait                 Stairs            Wheelchair Mobility    Modified Rankin (Stroke Patients Only)       Balance Overall balance assessment: Needs assistance Sitting-balance support: Feet supported;No upper extremity supported;Bilateral upper extremity supported Sitting balance-Leahy Scale: Poor       Standing balance-Leahy Scale: Zero                               Pertinent Vitals/Pain Pain Assessment: No/denies pain    Home Living Family/patient expects to be discharged to:: Skilled nursing facility                      Prior Function Level of Independence: Needs assistance         Comments: Pt living at home with total of 4 caregivers providing 24/7. walking  with supervisoin, reciving assistance for ADLs. Patient had a fall a month ago in bathroom hit her head.      Hand Dominance        Extremity/Trunk Assessment   Upper Extremity Assessment Upper Extremity Assessment: Difficult to assess due to impaired cognition    Lower Extremity Assessment Lower Extremity Assessment: Difficult to assess due to impaired cognition       Communication      Cognition Arousal/Alertness: Lethargic Behavior During Therapy: Flat affect Overall Cognitive Status: Impaired/Different from baseline  General Comments: h/o of demenita pt per caregiver patient is not at baseline, not able to follow commands or converse.       General Comments      Exercises     Assessment/Plan    PT Assessment Patient needs continued PT services  PT Problem List Decreased strength;Decreased activity tolerance;Decreased balance;Decreased mobility;Decreased coordination;Decreased cognition;Decreased knowledge of use of DME;Decreased safety awareness;Decreased knowledge of precautions       PT Treatment Interventions DME instruction;Gait training;Stair training;Functional mobility training;Therapeutic activities;Therapeutic  exercise;Balance training;Cognitive remediation;Patient/family education    PT Goals (Current goals can be found in the Care Plan section)  Acute Rehab PT Goals PT Goal Formulation: Patient unable to participate in goal setting Time For Goal Achievement: 01/27/18 Potential to Achieve Goals: Fair    Frequency Min 2X/week   Barriers to discharge        Co-evaluation               AM-PAC PT "6 Clicks" Mobility  Outcome Measure Help needed turning from your back to your side while in a flat bed without using bedrails?: A Lot Help needed moving from lying on your back to sitting on the side of a flat bed without using bedrails?: A Lot Help needed moving to and from a bed to a chair (including a wheelchair)?: A Lot Help needed standing up from a chair using your arms (e.g., wheelchair or bedside chair)?: Total Help needed to walk in hospital room?: Total Help needed climbing 3-5 steps with a railing? : Total 6 Click Score: 9    End of Session   Activity Tolerance: Patient limited by fatigue Patient left: in bed;with call bell/phone within reach;with family/visitor present;with nursing/sitter in room Nurse Communication: Mobility status PT Visit Diagnosis: Muscle weakness (generalized) (M62.81);Unsteadiness on feet (R26.81)    Time: [id number] PT Time Calculation (min) (ACUTE ONLY): 25 min   Charges:   PT Evaluation $PT Eval Moderate Complexity: 1 Mod PT Treatments $Therapeutic Activity: 8-22 mins        Amanda Atkins, PT, DPT Acute Rehabilitation Services Pager: [phone] Office: [phone]   Amanda Atkins 01/20/2018, 2:32 PM

## 2018-01-20 NOTE — Progress Notes (Addendum)
  ELECTROENCEPHALOGRAM REPORT  Date of Study: 01/19/18 MRN: [id number]  Clinical History:    84 y.o. female with medical history significant of dementia and atrial fibrillation not on anticoagulation due to risk of falls presenting with AMS. Patient had recently been being treated as outpatient for pneumonia with doxycycline and switched to Augmentin during hospitalization from 11/24-11/26. Patient returned to hospital on 11/29 with inability to walk and was held in the emergency department until [facility]. Brought back to the hospital found to have signs of new left sided infiltrate/atelectasis and initial lactic acid trending up to 2.27. Patient was started on empiric antibiotics of vancomycin and cefepime given recent hospitalization.   Technical Summary:  A multichannel digital EEG recording measured by the international 10-20 system with electrodes applied with paste and impedances below 5000 ohms performed in our laboratory with EKG monitoring in an awake and asleep patient. Hyperventilation and photic stimulation were not performed. The digital EEG was referentially recorded, reformatted, and digitally filtered in a variety of bipolar and referential montages for optimal display.  Description:  The patient is awake and drowsy during the recording. During maximal wakefulness, there is a symmetric, medium voltage 10 Hz posterior dominant rhythm that attenuates with eye opening. The record is symmetric. During drowsiness and sleep, there is an increase in theta slowing of the background. Vertex waves and symmetric sleep spindles were seen. throughpit the study, multiple triphasic waves were seen There were no electrographic seizures seen. EKG lead was unremarkable.  Impression: This awake and asleep EEG is abnormal due to generalized slowing with the presence of triphasic waves that can represent severe encephalopathy,There were no electrographic seizures in this  study.

## 2018-01-20 NOTE — Progress Notes (Signed)
[facility] Progress Note  Patient: Amanda Atkins ZOX:[id number]   PCP: Margaree MackintoshBaxley, Mary J, MD DOB: 09-Jan-1925   DOA: 01/18/2018   DOS: 01/20/2018   Date of Service: the patient was seen and examined on 01/20/2018  Brief hospital course: Pt. with PMH of dementia, A. fib; admitted on 01/18/2018, presented with complaint of confusion, was found to have acute encephalopathy. Currently further plan is further work-up for an encephalopathy.  Subjective:  Responsive but confused and lethargic.  No nausea no vomiting.  Unable to follow commands consistently.  No fall no trauma no injury. Reportedly was agitated last night.   Overnight telemetry shows a significant bradycardia with sinus pauses.  Assessment and Plan: 1.  Acute encephalopathy. Etiology unclear. Presents with an episode of unresponsiveness at the facility. Initial concern was for sepsis infection although I do not think that the patient actually has an active infection. Urine culture is unremarkable, chest x-ray unremarkable. Clinically no evidence of infection as well. CTA negative for PE. MRI brain negative for any acute abnormality. Metabolic work-up is also negative. EEG negative for any active seizures and shows metabolic encephalopathy pattern Orthostatic hypotension cannot be ruled out in the setting of patient having PAF as well as dementia. Telemetry shows pauses.  EP consult for possible etiology of tachybradycardia syndrome causing unresponsive events although they do not think that the pauses significant enough to cause any unresponsive episode. PT OT consulted.  2.  Dementia. Appears moderate to severe. Monitor for now.  3.  Paroxysmal A. fib. Patient is unable to take Cardizem sustained-release. Patient was on oral Cardizem tablet, although due to significant bradycardic events with pauses it is currently discontinued and will monitor on telemetry. Not on antiregulation due to recurrent falls.  4.  Goals  of care discussion. Family wants to maintain full code for now. We will discuss with them tomorrow depending on the work-up. Palliative care consult for further assistance.  Diet: dysphagia 2 DVT Prophylaxis: subcutaneous Heparin  Advance goals of care discussion: full code  Family Communication: family was present at bedside, at the time of interview. The pt provided permission to discuss medical plan with the family. Opportunity was given to ask question and all questions were answered satisfactorily.   Disposition:  Discharge to SNF.  Consultants: none Procedures: noen  Scheduled Meds: . enoxaparin (LOVENOX) injection  30 mg Subcutaneous Q24H  . senna-docusate  1 tablet Oral QHS   Continuous Infusions: PRN Meds:  Antibiotics: Anti-infectives (From admission, onward)   Start     Dose/Rate Route Frequency Ordered Stop   01/20/18 0400  vancomycin (VANCOCIN) IVPB 750 mg/150 ml premix  Status:  Discontinued     750 mg 150 mL/hr over 60 Minutes Intravenous Every 48 hours 01/18/18 0426 01/19/18 1045   01/19/18 0600  ceFEPIme (MAXIPIME) 1 g in sodium chloride 0.9 % 100 mL IVPB  Status:  Discontinued     1 g 200 mL/hr over 30 Minutes Intravenous Every 24 hours 01/18/18 0802 01/18/18 1741   01/18/18 1830  piperacillin-tazobactam (ZOSYN) IVPB 3.375 g  Status:  Discontinued     3.375 g 12.5 mL/hr over 240 Minutes Intravenous Every 8 hours 01/18/18 1749 01/19/18 1045   01/18/18 0415  vancomycin (VANCOCIN) IVPB 1000 mg/200 mL premix     1,000 mg 200 mL/hr over 60 Minutes Intravenous  Once 01/18/18 0404 01/18/18 0600   01/18/18 0400  ceFEPIme (MAXIPIME) 1 g in sodium chloride 0.9 % 100 mL IVPB     1 g  200 mL/hr over 30 Minutes Intravenous  Once 01/18/18 0355 01/18/18 0745       Objective: Physical Exam: Vitals:   01/19/18 0925 01/20/18 0003 01/20/18 0843 01/20/18 1742  BP: (!) 154/94 (!) 131/98 (!) 182/94 (!) 149/121  Pulse:  (!) 110 90 94  Resp:  16    Temp: (!) 97.1 F (36.2  C) (!) 97.5 F (36.4 C) 97.8 F (36.6 C)   TempSrc: Axillary Axillary    SpO2:  94% 97% 97%  Weight:      Height:       No intake or output data in the 24 hours ending 01/20/18 1833 Filed Weights   01/18/18 1419  Weight: 47.6 kg   General: Alert, Awake and Oriented to Time, Place and Person. Appear in mild distress, affect appropriate Eyes: PERRL, Conjunctiva normal ENT: Oral Mucosa clear moist. Neck: no JVD, no Abnormal Mass Or lumps Cardiovascular: S1 and S2 Present, ap  Murmur, Peripheral Pulses Present Respiratory: normal respiratory effort, Bilateral Air entry equal and Decreased, no use of accessory muscle, Clear to Auscultation, no Crackles, no wheezes Abdomen: Bowel Sound present, Soft and no tenderness, no hernia Skin: no redness, no Rash, no induration Extremities: no Pedal edema, no calf tenderness Neurologic: Grossly no focal neuro deficit. Bilaterally Equal motor strength  Data Reviewed: CBC: Recent Labs  Lab 01/14/18 1405 01/18/18 0144 01/19/18 0540  WBC 5.2 10.0 6.1  NEUTROABS 2.8  --  3.5  HGB 11.7* 12.5 11.6*  HCT 38.8 40.4 36.2  MCV 93.9 89.4 88.1  PLT 242 245 269   Basic Metabolic Panel: Recent Labs  Lab 01/14/18 1405 01/18/18 0144 01/19/18 0540 01/20/18 0337  NA 140 141 141  --   K 4.0 4.1 3.8  --   CL 106 105 107  --   CO2 26 26 21*  --   GLUCOSE 153* 153* 105*  --   BUN 19 19 11   --   CREATININE 0.85 1.09* 0.97  --   CALCIUM 9.3 9.6 9.0  --   MG  --   --   --  1.9    Liver Function Tests: Recent Labs  Lab 01/14/18 1405 01/18/18 0144  AST 31 33  ALT 21 24  ALKPHOS 77 77  BILITOT 0.6 0.8  PROT 6.5 6.7  ALBUMIN 3.8 3.5   No results for input(s): LIPASE, AMYLASE in the last 168 hours. Recent Labs  Lab 01/19/18 1124  AMMONIA 33   Coagulation Profile: No results for input(s): INR, PROTIME in the last 168 hours. Cardiac Enzymes: No results for input(s): CKTOTAL, CKMB, CKMBINDEX, TROPONINI in the last 168 hours. BNP (last 3  results) No results for input(s): PROBNP in the last 8760 hours. CBG: No results for input(s): GLUCAP in the last 168 hours. Studies: No results found.   Time spent: 35 minutes  Author: Lynden Oxford, MD [facility] Pager: [phone] 01/20/2018 6:33 PM  Between 7PM-7AM, please contact night-coverage at [email or web address], password [facility]

## 2018-01-21 ENCOUNTER — Encounter (HOSPITAL_COMMUNITY): Payer: Self-pay | Admitting: Primary Care

## 2018-01-21 DIAGNOSIS — F0391 Unspecified dementia with behavioral disturbance: Secondary | ICD-10-CM

## 2018-01-21 DIAGNOSIS — Z7189 Other specified counseling: Secondary | ICD-10-CM

## 2018-01-21 DIAGNOSIS — Z515 Encounter for palliative care: Secondary | ICD-10-CM

## 2018-01-21 MED ORDER — HYDRALAZINE HCL 20 MG/ML IJ SOLN
5.0000 mg | Freq: Once | INTRAMUSCULAR | Status: AC
  Administered 2018-01-21: 5 mg via INTRAVENOUS
  Filled 2018-01-21: qty 1

## 2018-01-21 MED ORDER — DILTIAZEM HCL 30 MG PO TABS: 30.0000 mg | ORAL_TABLET | Freq: Four times a day (QID) | ORAL | Status: DC | PRN

## 2018-01-21 MED ORDER — QUETIAPINE FUMARATE 25 MG PO TABS
12.5000 mg | ORAL_TABLET | Freq: Every day | ORAL | Status: DC
  Administered 2018-01-21 – 2018-01-24 (×4): 12.5 mg via ORAL
  Filled 2018-01-21 (×4): qty 1
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
## 2018-01-21 NOTE — Progress Notes (Addendum)
[facility] Progress Note  Patient: Amanda Atkins:[id number]   PCP: Margaree Mackintosh, MD DOB: 1924-08-26   DOA: 01/18/2018   DOS: 01/21/2018   Date of Service: the patient was seen and examined on 01/21/2018  Brief hospital course: Pt. with PMH of dementia, A. fib; admitted on 01/18/2018, presented with complaint of confusion, was found to have acute encephalopathy. Currently further plan is further work-up for an encephalopathy.  Subjective:  Awake, following commands during our conversation.  No nausea no vomiting.  Oral intake remains minimal..  Assessment and Plan: 1.  Acute encephalopathy. Etiology unclear. Presents with an episode of unresponsiveness at the facility. Initial concern was for sepsis infection although I do not think that the patient actually has an active infection. Urine culture is unremarkable, chest x-ray unremarkable. Clinically no evidence of infection as well. CTA negative for PE. MRI brain negative for any acute abnormality. Metabolic work-up is also negative. EEG negative for any active seizures and shows metabolic encephalopathy pattern Orthostatic hypotension cannot be ruled out in the setting of patient having PAF as well as dementia. Telemetry shows pauses.  EP consult for possible etiology of tachybradycardia syndrome causing unresponsive events although they do not think that the pauses significant enough to cause any unresponsive episode. PT OT consulted. The daughter informed me today that she has been giving the patient her home Xanax here in the hospital on her own for last 2 days.  Inform her that I would prefer not to use Xanax and I would definitely prefer her not to give any medication that is not prescribed by a provider here in the hospital.  2.  Dementia. Appears moderate to severe. Monitor for now.  3.  Paroxysmal A. fib. Patient is unable to take Cardizem sustained-release. Patient was on oral Cardizem tablet, although due to  significant bradycardic events with pauses it is currently discontinued and will monitor on telemetry. Not on antiregulation due to recurrent falls. We will start PRN Cardizem 30 mg for tachycardia.  4.  Goals of care discussion. Family wants to maintain full code for now. We will discuss with them tomorrow depending on the work-up. Palliative care consult for further assistance.  Diet: dysphagia 2 DVT Prophylaxis: subcutaneous Heparin  Advance goals of care discussion: full code  Family Communication: family was present at bedside, at the time of interview. The pt provided permission to discuss medical plan with the family. Opportunity was given to ask question and all questions were answered satisfactorily.   Disposition:  Discharge to SNF.  Consultants: none Procedures: noen  Scheduled Meds: . enoxaparin (LOVENOX) injection  30 mg Subcutaneous Q24H  . QUEtiapine  12.5 mg Oral QHS  . senna-docusate  1 tablet Oral QHS   Continuous Infusions: PRN Meds:  Antibiotics: Anti-infectives (From admission, onward)   Start     Dose/Rate Route Frequency Ordered Stop   01/20/18 0400  vancomycin (VANCOCIN) IVPB 750 mg/150 ml premix  Status:  Discontinued     750 mg 150 mL/hr over 60 Minutes Intravenous Every 48 hours 01/18/18 0426 01/19/18 1045   01/19/18 0600  ceFEPIme (MAXIPIME) 1 g in sodium chloride 0.9 % 100 mL IVPB  Status:  Discontinued     1 g 200 mL/hr over 30 Minutes Intravenous Every 24 hours 01/18/18 0802 01/18/18 1741   01/18/18 1830  piperacillin-tazobactam (ZOSYN) IVPB 3.375 g  Status:  Discontinued     3.375 g 12.5 mL/hr over 240 Minutes Intravenous Every 8 hours 01/18/18 1749 01/19/18 1045  01/18/18 0415  vancomycin (VANCOCIN) IVPB 1000 mg/200 mL premix     1,000 mg 200 mL/hr over 60 Minutes Intravenous  Once 01/18/18 0404 01/18/18 0600   01/18/18 0400  ceFEPIme (MAXIPIME) 1 g in sodium chloride 0.9 % 100 mL IVPB     1 g 200 mL/hr over 30 Minutes Intravenous  Once  01/18/18 0355 01/18/18 0745       Objective: Physical Exam: Vitals:   01/21/18 0000 01/21/18 0425 01/21/18 0753 01/21/18 1650  BP: (!) 182/99 115/70 105/62 (!) 144/80  Pulse:  61 62 (!) 118  Resp:   12 13  Temp:   (!) 96.9 F (36.1 C) (!) 97.3 F (36.3 C)  TempSrc:   Oral Oral  SpO2:   98% 98%  Weight:      Height:       No intake or output data in the 24 hours ending 01/21/18 1929 Filed Weights   01/18/18 1419  Weight: 47.6 kg   General: Alert, Awake and Oriented to Time, Place and Person. Appear in mild distress, affect appropriate Eyes: PERRL, Conjunctiva normal ENT: Oral Mucosa clear moist. Neck: no JVD, no Abnormal Mass Or lumps Cardiovascular: S1 and S2 Present, ap  Murmur, Peripheral Pulses Present Respiratory: normal respiratory effort, Bilateral Air entry equal and Decreased, no use of accessory muscle, Clear to Auscultation, no Crackles, no wheezes Abdomen: Bowel Sound present, Soft and no tenderness, no hernia Skin: no redness, no Rash, no induration Extremities: no Pedal edema, no calf tenderness Neurologic: Grossly no focal neuro deficit. Bilaterally Equal motor strength  Data Reviewed: CBC: Recent Labs  Lab 01/18/18 0144 01/19/18 0540  WBC 10.0 6.1  NEUTROABS  --  3.5  HGB 12.5 11.6*  HCT 40.4 36.2  MCV 89.4 88.1  PLT 245 269   Basic Metabolic Panel: Recent Labs  Lab 01/18/18 0144 01/19/18 0540 01/20/18 0337  NA 141 141  --   K 4.1 3.8  --   CL 105 107  --   CO2 26 21*  --   GLUCOSE 153* 105*  --   BUN 19 11  --   CREATININE 1.09* 0.97  --   CALCIUM 9.6 9.0  --   MG  --   --  1.9    Liver Function Tests: Recent Labs  Lab 01/18/18 0144  AST 33  ALT 24  ALKPHOS 77  BILITOT 0.8  PROT 6.7  ALBUMIN 3.5   No results for input(s): LIPASE, AMYLASE in the last 168 hours. Recent Labs  Lab 01/19/18 1124  AMMONIA 33   Coagulation Profile: No results for input(s): INR, PROTIME in the last 168 hours. Cardiac Enzymes: No results for  input(s): CKTOTAL, CKMB, CKMBINDEX, TROPONINI in the last 168 hours. BNP (last 3 results) No results for input(s): PROBNP in the last 8760 hours. CBG: No results for input(s): GLUCAP in the last 168 hours. Studies: No results found.   Time spent: 35 minutes  Author: Lynden OxfordPranav Adriauna Campton, MD [facility] Pager: [phone] 01/21/2018 7:29 PM  Between 7PM-7AM, please contact night-coverage at [email or web address], password [email or web address]

## 2018-01-21 NOTE — Progress Notes (Signed)
   01/21/18 1026  Clinical Encounter Type  Visited With Patient;Other (Comment) (Caregiver)  Visit Type Initial  Referral From Chaplain  Consult/Referral To Chaplain  The chaplain introduced herself through a spiritual care visit with Pt. and caregiver-Mary Dewayne HatchAnn.  The care giver shared a brief biography of the Pt.'s quality of life before the onset of dementia.  The Pt. enjoyed a full career and extensive world travel with her husband.  The chaplain's limited conversation with the Pt. was about her twin sister-Inez.  The chaplain offered to follow up with spiritual care as needed.

## 2018-01-22 DIAGNOSIS — Z7189 Other specified counseling: Secondary | ICD-10-CM

## 2018-01-22 LAB — CBC
HCT: 38.7 % (ref 36.0–46.0)
HEMOGLOBIN: 11.9 g/dL — AB (ref 12.0–15.0)
MCH: 27 pg (ref 26.0–34.0)
MCHC: 30.7 g/dL (ref 30.0–36.0)
MCV: 88 fL (ref 80.0–100.0)
Platelets: 239 10*3/uL (ref 150–400)
RBC: 4.4 MIL/uL (ref 3.87–5.11)
RDW: 14.3 % (ref 11.5–15.5)
WBC: 5.4 10*3/uL (ref 4.0–10.5)
nRBC: 0 % (ref 0.0–0.2)

## 2018-01-22 LAB — BASIC METABOLIC PANEL
Anion gap: 11 (ref 5–15)
BUN: 14 mg/dL (ref 8–23)
CHLORIDE: 107 mmol/L (ref 98–111)
CO2: 23 mmol/L (ref 22–32)
Calcium: 9.4 mg/dL (ref 8.9–10.3)
Creatinine, Ser: 0.89 mg/dL (ref 0.44–1.00)
GFR calc Af Amer: >60 mL/min (ref >60)
GFR calc non Af Amer: 57 mL/min — ABNORMAL LOW (ref >60)
Glucose, Bld: 102 mg/dL — ABNORMAL HIGH (ref 70–99)
Potassium: 3.5 mmol/L (ref 3.5–5.1)
Sodium: 141 mmol/L (ref 135–145)

## 2018-01-22 NOTE — Progress Notes (Signed)
Palliative:  Mrs. Danielski is resting in bed with sitter Clerance LavMarianne at bedside.  Present today is daughter Judie GrieveRhonda Gallimore and her husband Jillyn HiddenGary.  Mrs. Taplin has known dementia and is oriented to self only, she must be frequently redirected or she tries to exit the bed.  We talked him the room for a few minutes before realizing we need a more private area due to distractions.  Pam Drownhonda, Gary and I go to a private meeting room.  We talked about the chronic illness pathway for dementia including the mental status changes, nutritional status changes, and functional status changes.  I share my concerns when any person with dementia is taken out of their known element.  I share that there should be a few weeks of reorientation where she is at risk for illness and injury.  I share that there may also be a few weeks of reorientation when she returns home.  Bjorn LoserRhonda states she would not want her mother to go to [facility] but instead wants [facility] or [place] where her twin sister lives.  Bjorn LoserRhonda and Jillyn HiddenGary states that they have experience with people who are suffering with dementia as Mrs. Bartell's 2 sisters and Gary's mother experienced dementia.  I share a diagram of the chronic illness pathway, what is normal and expected.  We talked about the concept of let nature take its course, I give an example of when this would be appropriate.  When she first hears this, Bjorn LoserRhonda states "that's immoral!".  We talked about modern medicine prolonging life, but also prolonging the dying process, and thereby suffering.  We talked about how to make choices for loved ones including 1) keeping them at the center of decision-making (not "I want dad to make it to my 40th anniversary", that is not about dad);   2) are we doing something for them, or to them (can we change what is happening, I share that we cannot change her dementia);   3) what would the woman Mrs. Feighner was 10 years ago say about her chronic  health condition now?  How would she tell them to care for her?  We briefly talked about PEG tube (not recommended) and Bjorn LoserRhonda makes a face like she would not want to do that to her mother. I encourage Bjorn LoserRhonda and Jillyn HiddenGary to have thought about choices in advance so that they make choices based on what Mrs. Rish would and would not want.  We talked about CODE STATUS in detail.  At this point Bjorn LoserRhonda states that she is considering "allow a natural death" but needs time to think.  We talked about the concept of treat the treatable, but if/when her heart naturally stops to "allow a natural death", no CPR, no intubation.  I share that IF CPR and intubation were able to revive Mrs. Santoni, she would still have her chronic illness burden including advancing dementia.  Conference with nursing staff related to goals of care discussion. Conference with social worker related to goals of care discussion, disposition. Conference with hospitalist related to goals of care discussion, disposition discussion, CODE STATUS discussion.  75 minutes, extended time Lillia Carmelasha Dove, NP Palliative Medicine Team  [phone] Greater than 50%  of this time was spent counseling and coordinating care related to the above assessment and plan.

## 2018-01-22 NOTE — Progress Notes (Signed)
Patient continues to attempt to get out of bed. Redirected over and over.

## 2018-01-22 NOTE — Progress Notes (Signed)
[facility] Progress Note  Patient: Amanda Atkins WUJ:[id number]   PCP: Margaree MackintoshBaxley, Mary J, MD DOB: April 15, 1924   DOA: 01/18/2018   DOS: 01/22/2018   Date of Service: the patient was seen and examined on 01/22/2018  Brief hospital course: Pt. with PMH of dementia, A. fib; admitted on 01/18/2018, presented with complaint of confusion, was found to have acute encephalopathy. Currently further plan is further work-up for an encephalopathy.  Subjective:  No acute complains no fever or chills.   Assessment and Plan: 1.  Acute encephalopathy. Etiology unclear. Presents with an episode of unresponsiveness at the facility. Initial concern was for sepsis infection although I do not think that the patient actually has an active infection. Urine culture is unremarkable, chest x-ray unremarkable. Clinically no evidence of infection as well. CTA negative for PE. MRI brain negative for any acute abnormality. Metabolic work-up is also negative. EEG negative for any active seizures and shows metabolic encephalopathy pattern Orthostatic hypotension cannot be ruled out in the setting of patient having PAF as well as dementia. Telemetry shows pauses.  EP consult for possible etiology of tachybradycardia syndrome causing unresponsive events although they do not think that the pauses significant enough to cause any unresponsive episode. PT OT consulted. The daughter informed me that she has been giving the patient her home Xanax here in the hospital on her own for last 2 days.  Inform her that I would prefer not to use Xanax and I would definitely prefer her not to give any medication that is not prescribed by a provider here in the hospital.  2.  Dementia. Appears moderate to severe. Monitor for now.  3.  Paroxysmal A. fib. Patient is unable to take Cardizem sustained-release. Patient was on oral Cardizem tablet, although due to significant bradycardic events with pauses it is currently discontinued  and will monitor on telemetry. Not on antiregulation due to recurrent falls. We will start PRN Cardizem 30 mg for tachycardia.  4.  Goals of care discussion. Family wants to maintain full code for now. We will discuss with them tomorrow depending on the work-up. Palliative care consult for further assistance.  Diet: dysphagia 2 DVT Prophylaxis: subcutaneous Heparin  Advance goals of care discussion: full code  Family Communication: family was present at bedside, at the time of interview. The pt provided permission to discuss medical plan with the family. Opportunity was given to ask question and all questions were answered satisfactorily.   Disposition:  Discharge to SNF.  Consultants: none Procedures: noen  Scheduled Meds: . enoxaparin (LOVENOX) injection  30 mg Subcutaneous Q24H  . QUEtiapine  12.5 mg Oral QHS  . senna-docusate  1 tablet Oral QHS   Continuous Infusions: PRN Meds:  Antibiotics: Anti-infectives (From admission, onward)   Start     Dose/Rate Route Frequency Ordered Stop   01/20/18 0400  vancomycin (VANCOCIN) IVPB 750 mg/150 ml premix  Status:  Discontinued     750 mg 150 mL/hr over 60 Minutes Intravenous Every 48 hours 01/18/18 0426 01/19/18 1045   01/19/18 0600  ceFEPIme (MAXIPIME) 1 g in sodium chloride 0.9 % 100 mL IVPB  Status:  Discontinued     1 g 200 mL/hr over 30 Minutes Intravenous Every 24 hours 01/18/18 0802 01/18/18 1741   01/18/18 1830  piperacillin-tazobactam (ZOSYN) IVPB 3.375 g  Status:  Discontinued     3.375 g 12.5 mL/hr over 240 Minutes Intravenous Every 8 hours 01/18/18 1749 01/19/18 1045   01/18/18 0415  vancomycin (VANCOCIN) IVPB 1000  mg/200 mL premix     1,000 mg 200 mL/hr over 60 Minutes Intravenous  Once 01/18/18 0404 01/18/18 0600   01/18/18 0400  ceFEPIme (MAXIPIME) 1 g in sodium chloride 0.9 % 100 mL IVPB     1 g 200 mL/hr over 30 Minutes Intravenous  Once 01/18/18 0355 01/18/18 0745       Objective: Physical  Exam: Vitals:   01/21/18 1650 01/21/18 2124 01/22/18 0734 01/22/18 1549  BP: (!) 144/80 (!) 158/80 99/69 (!) 145/81  Pulse: (!) 118 (!) 131 73 93  Resp: 13 16 12 13   Temp: (!) 97.3 F (36.3 C) 98.9 F (37.2 C) 97.9 F (36.6 C) 98.2 F (36.8 C)  TempSrc: Oral Oral Oral Oral  SpO2: 98% 98% 98% 100%  Weight:      Height:        Intake/Output Summary (Last 24 hours) at 01/22/2018 1733 Last data filed at 01/21/2018 2130 Gross per 24 hour  Intake 290 ml  Output -  Net 290 ml   Filed Weights   01/18/18 1419  Weight: 47.6 kg   General: Alert, Awake and Oriented to Time, Place and Person. Appear in mild distress, affect appropriate Eyes: PERRL, Conjunctiva normal ENT: Oral Mucosa clear moist. Neck: no JVD, no Abnormal Mass Or lumps Cardiovascular: S1 and S2 Present, ap  Murmur, Peripheral Pulses Present Respiratory: normal respiratory effort, Bilateral Air entry equal and Decreased, no use of accessory muscle, Clear to Auscultation, no Crackles, no wheezes Abdomen: Bowel Sound present, Soft and no tenderness, no hernia Skin: no redness, no Rash, no induration Extremities: no Pedal edema, no calf tenderness Neurologic: Grossly no focal neuro deficit. Bilaterally Equal motor strength  Data Reviewed: CBC: Recent Labs  Lab 01/18/18 0144 01/19/18 0540 01/22/18 0306  WBC 10.0 6.1 5.4  NEUTROABS  --  3.5  --   HGB 12.5 11.6* 11.9*  HCT 40.4 36.2 38.7  MCV 89.4 88.1 88.0  PLT 245 269 239   Basic Metabolic Panel: Recent Labs  Lab 01/18/18 0144 01/19/18 0540 01/20/18 0337 01/22/18 0306  NA 141 141  --  141  K 4.1 3.8  --  3.5  CL 105 107  --  107  CO2 26 21*  --  23  GLUCOSE 153* 105*  --  102*  BUN 19 11  --  14  CREATININE 1.09* 0.97  --  0.89  CALCIUM 9.6 9.0  --  9.4  MG  --   --  1.9  --     Liver Function Tests: Recent Labs  Lab 01/18/18 0144  AST 33  ALT 24  ALKPHOS 77  BILITOT 0.8  PROT 6.7  ALBUMIN 3.5   No results for input(s): LIPASE, AMYLASE in  the last 168 hours. Recent Labs  Lab 01/19/18 1124  AMMONIA 33   Coagulation Profile: No results for input(s): INR, PROTIME in the last 168 hours. Cardiac Enzymes: No results for input(s): CKTOTAL, CKMB, CKMBINDEX, TROPONINI in the last 168 hours. BNP (last 3 results) No results for input(s): PROBNP in the last 8760 hours. CBG: No results for input(s): GLUCAP in the last 168 hours. Studies: No results found.   Time spent: 35 minutes  Author: Lynden Oxford, MD [facility] Pager: [phone] 01/22/2018 5:33 PM  Between 7PM-7AM, please contact night-coverage at [email or web address], password [facility]

## 2018-01-22 NOTE — Progress Notes (Signed)
Patient has slept in long interval after PM medication awakened to void, ambulated to bathroom and back to bed. Confused easily redirected.

## 2018-01-23 LAB — CULTURE, BLOOD (ROUTINE X 2)
Culture: NO GROWTH
Culture: NO GROWTH
Special Requests: ADEQUATE

## 2018-01-23 NOTE — Progress Notes (Signed)
[facility] Progress Note  Patient: Amanda Atkins [id number]:[id number]   PCP: Margaree Mackintosh, MD DOB: [birth year]   DOA: 01/18/2018   DOS: 01/23/2018   Date of Service: the patient was seen and examined on 01/23/2018  Brief hospital course: Pt. with PMH of dementia, A. fib; admitted on 01/18/2018, presented with complaint of confusion, was found to have acute encephalopathy. Currently further plan is further work-up for an encephalopathy.  Subjective:  No fever no chills.  Eating okay.  No agitation last night.  Assessment and Plan: 1.  Acute encephalopathy. Etiology unclear. Presents with an episode of unresponsiveness at the facility. Initial concern was for sepsis infection although I do not think that the patient actually has an active infection. Urine culture is unremarkable, chest x-ray unremarkable. Clinically no evidence of infection as well. CTA negative for PE. MRI brain negative for any acute abnormality. Metabolic work-up is also negative. EEG negative for any active seizures and shows metabolic encephalopathy pattern Orthostatic hypotension cannot be ruled out in the setting of patient having PAF as well as dementia. Telemetry shows pauses.  EP consult for possible etiology of tachybradycardia syndrome causing unresponsive events although they do not think that the pauses significant enough to cause any unresponsive episode. PT OT consulted. The daughter informed me that she has been giving the patient her home Xanax here in the hospital on her own for last 2 days.  Inform her that I would prefer not to use Xanax and I would definitely prefer her not to give any medication that is not prescribed by a provider here in the hospital.  2.  Dementia. Appears moderate to severe. Monitor for now.  3.  Paroxysmal A. fib. Patient is unable to take Cardizem sustained-release. Patient was on oral Cardizem tablet, although due to significant bradycardic events with pauses it is  currently discontinued and will monitor on telemetry. Not on antiregulation due to recurrent falls. Continue PRN Cardizem 30 mg for tachycardia.  4.  Goals of care discussion. Family wants to maintain full code for now. Highly appreciate palliative care consultation. Patient and family will benefit from outpatient palliative [facility] at [facility].  Diet: dysphagia 2 DVT Prophylaxis: subcutaneous Heparin  Advance goals of care discussion: full code  Family Communication: family was present at bedside, at the time of interview. The pt provided permission to discuss medical plan with the family. Opportunity was given to ask question and all questions were answered satisfactorily.   Disposition:  Discharge to SNF once bed available. Medically stable.   Consultants: none Procedures: noen  Scheduled Meds: . enoxaparin (LOVENOX) injection  30 mg Subcutaneous Q24H  . QUEtiapine  12.5 mg Oral QHS  . senna-docusate  1 tablet Oral QHS   Continuous Infusions: PRN Meds:  Antibiotics: Anti-infectives (From admission, onward)   Start     Dose/Rate Route Frequency Ordered Stop   01/20/18 0400  vancomycin (VANCOCIN) IVPB 750 mg/150 ml premix  Status:  Discontinued     750 mg 150 mL/hr over 60 Minutes Intravenous Every 48 hours 01/18/18 0426 01/19/18 1045   01/19/18 0600  ceFEPIme (MAXIPIME) 1 g in sodium chloride 0.9 % 100 mL IVPB  Status:  Discontinued     1 g 200 mL/hr over 30 Minutes Intravenous Every 24 hours 01/18/18 0802 01/18/18 1741   01/18/18 1830  piperacillin-tazobactam (ZOSYN) IVPB 3.375 g  Status:  Discontinued     3.375 g 12.5 mL/hr over 240 Minutes Intravenous Every 8 hours 01/18/18 1749 01/19/18 1045  01/18/18 0415  vancomycin (VANCOCIN) IVPB 1000 mg/200 mL premix     1,000 mg 200 mL/hr over 60 Minutes Intravenous  Once 01/18/18 0404 01/18/18 0600   01/18/18 0400  ceFEPIme (MAXIPIME) 1 g in sodium chloride 0.9 % 100 mL IVPB     1 g 200 mL/hr over 30 Minutes Intravenous  Once  01/18/18 0355 01/18/18 0745       Objective: Physical Exam: Vitals:   01/22/18 0734 01/22/18 1549 01/22/18 2202 01/23/18 0842  BP: 99/69 (!) 145/81 106/73 138/71  Pulse: 73 93 90 86  Resp: 12 13 18 20   Temp: 97.9 F (36.6 C) 98.2 F (36.8 C) 98.4 F (36.9 C) 98 F (36.7 C)  TempSrc: Oral Oral Axillary Axillary  SpO2: 98% 100% 98% 100%  Weight:      Height:       No intake or output data in the 24 hours ending 01/23/18 1749 Filed Weights   01/18/18 1419  Weight: 47.6 kg   General: Alert, Awake and Oriented to Time, Place and Person. Appear in mild distress, affect appropriate Eyes: PERRL, Conjunctiva normal ENT: Oral Mucosa clear moist. Neck: no JVD, no Abnormal Mass Or lumps Cardiovascular: S1 and S2 Present, ap  Murmur, Peripheral Pulses Present Respiratory: normal respiratory effort, Bilateral Air entry equal and Decreased, no use of accessory muscle, Clear to Auscultation, no Crackles, no wheezes Abdomen: Bowel Sound present, Soft and no tenderness, no hernia Skin: no redness, no Rash, no induration Extremities: no Pedal edema, no calf tenderness Neurologic: Grossly no focal neuro deficit. Bilaterally Equal motor strength  Data Reviewed: CBC: Recent Labs  Lab 01/18/18 0144 01/19/18 0540 01/22/18 0306  WBC 10.0 6.1 5.4  NEUTROABS  --  3.5  --   HGB 12.5 11.6* 11.9*  HCT 40.4 36.2 38.7  MCV 89.4 88.1 88.0  PLT 245 269 239   Basic Metabolic Panel: Recent Labs  Lab 01/18/18 0144 01/19/18 0540 01/20/18 0337 01/22/18 0306  NA 141 141  --  141  K 4.1 3.8  --  3.5  CL 105 107  --  107  CO2 26 21*  --  23  GLUCOSE 153* 105*  --  102*  BUN 19 11  --  14  CREATININE 1.09* 0.97  --  0.89  CALCIUM 9.6 9.0  --  9.4  MG  --   --  1.9  --     Liver Function Tests: Recent Labs  Lab 01/18/18 0144  AST 33  ALT 24  ALKPHOS 77  BILITOT 0.8  PROT 6.7  ALBUMIN 3.5   No results for input(s): LIPASE, AMYLASE in the last 168 hours. Recent Labs  Lab  01/19/18 1124  AMMONIA 33   Coagulation Profile: No results for input(s): INR, PROTIME in the last 168 hours. Cardiac Enzymes: No results for input(s): CKTOTAL, CKMB, CKMBINDEX, TROPONINI in the last 168 hours. BNP (last 3 results) No results for input(s): PROBNP in the last 8760 hours. CBG: No results for input(s): GLUCAP in the last 168 hours. Studies: No results found.   Time spent: 25 minutes  Author: Lynden OxfordPranav Aaliyan Brinkmeier, MD [facility] Pager: [phone] 01/23/2018 5:49 PM  Between 7PM-7AM, please contact night-coverage at [email or web address], password [email or web address]

## 2018-01-24 MED ORDER — FUROSEMIDE 20 MG PO TABS: 20.0000 mg | ORAL_TABLET | ORAL | 0 refills | Status: DC

## 2018-01-24 MED ORDER — DILTIAZEM HCL 30 MG PO TABS: 30.0000 mg | ORAL_TABLET | Freq: Four times a day (QID) | ORAL | 0 refills | Status: DC | PRN

## 2018-01-24 MED ORDER — POTASSIUM CHLORIDE CRYS ER 20 MEQ PO TBCR: 20.0000 meq | EXTENDED_RELEASE_TABLET | ORAL | 0 refills | Status: AC

## 2018-01-24 MED ORDER — QUETIAPINE FUMARATE 25 MG PO TABS: 12.5000 mg | ORAL_TABLET | Freq: Every day | ORAL | 0 refills | Status: DC

## 2018-01-24 NOTE — Clinical Social Work Note (Signed)
[facility] will take pt--pt's daughter agreeable. [facility]   [place]arden CityBridget Seanne [place], [place] [phone]

## 2018-01-24 NOTE — Progress Notes (Signed)
01/24/2018 PT stepped in to help caregivers as pt was standing half using her RW and attempting to put her slippers on in standing.  Pt then indicated she wanted to go for another walk and PT assisted in facilitating the walk.  She was moving too fast to be safe and continues to need cues to consistently use RW.  PT will continue to follow acutely for safe mobility progression.  Rollene Rotunda Shama Monfils, PT, DPT  Acute Rehabilitation [phone] pager [phone] office      01/24/18 1721  PT Visit Information  Last PT Received On 01/24/18  Assistance Needed +1  History of Present Illness Pt. with PMH of dementia, A. fib; admitted on 01/18/2018, presented with complaint of confusion, was found to have acute encephalopathy.  Subjective Data  Patient Stated Goal to go home  Precautions  Precautions Fall  Precaution Comments very unsteady on her feet and impulsive with poor safety awareness.   Pain Assessment  Pain Assessment Faces  Pain Score 1  Cognition  Arousal/Alertness Awake/alert  Behavior During Therapy Impulsive;Restless  Overall Cognitive Status History of cognitive impairments - at baseline  Bed Mobility  General bed mobility comments Pt standing up in the middle of the room half using the RW with her caregiver, standing to try to get her slipper donned.  PT stepped in to help.   Transfers  Overall transfer level Needs assistance  Equipment used Rolling walker (2 wheeled)  Transfers Sit to/from Stand  Sit to Stand Min assist  General transfer comment Min assist to help control descent to sit and ensure pt hit her sitting target.  Ambulation/Gait  Ambulation/Gait assistance Min assist  Gait Distance (Feet) 150 Feet  Assistive device Rolling walker (2 wheeled)  Gait Pattern/deviations Step-through pattern;Shuffle;Trunk flexed  General Gait Details Pt with flexed trunk posture, RW too far ahead of her, tendancy to have LOB forward (trunk forward of her feet) and pt parks  RW in the middle of the hallway and then starts to walk away from it.  Assist and max cues needed to keep hands on RW and continue to use it while we are walking. Pt's speed was faster this PM and she needed PT to slow walker down and keep it closer to her body.   Balance  Overall balance assessment Needs assistance  Sitting-balance support Feet supported;Bilateral upper extremity supported  Sitting balance-Leahy Scale Fair  Standing balance support Bilateral upper extremity supported  Standing balance-Leahy Scale Poor  Standing balance comment Pt attempting to reach to ground to put her own slipper on in standing ane holding RW with one hand, mod assist to prevent fall.   PT - End of Session  Equipment Utilized During Treatment Gait belt  Activity Tolerance Patient tolerated treatment well  Patient left in chair;with call bell/phone within reach;with family/visitor present   PT - Assessment/Plan  PT Plan Current plan remains appropriate  PT Visit Diagnosis Muscle weakness (generalized) (M62.81);Unsteadiness on feet (R26.81)  PT Frequency (ACUTE ONLY) Min 2X/week  Follow Up Recommendations SNF  PT equipment None recommended by PT  AM-PAC PT "6 Clicks" Mobility Outcome Measure (Version 2)  Help needed turning from your back to your side while in a flat bed without using bedrails? 3  Help needed moving from lying on your back to sitting on the side of a flat bed without using bedrails? 3  Help needed moving to and from a bed to a chair (including a wheelchair)? 3  Help needed standing up  from a chair using your arms (e.g., wheelchair or bedside chair)? 3  Help needed to walk in hospital room? 3  Help needed climbing 3-5 steps with a railing?  2  6 Click Score 17  Consider [facility]ation of Discharge [facility]  PT Goal Progression  Progress towards PT goals Progressing toward goals  PT Time Calculation  PT Start Time (ACUTE ONLY) 1625  PT Stop Time (ACUTE ONLY) 1637  PT Time  Calculation (min) (ACUTE ONLY) 12 min  PT General Charges  $$ ACUTE PT VISIT 1 Visit  PT Treatments  $Gait Training 8-22 mins

## 2018-01-24 NOTE — Progress Notes (Signed)
Patient has had no safety sitter at bedside in the last 72 hours. Last documented sitter present was Friday 13th.

## 2018-01-24 NOTE — Progress Notes (Signed)
Pt has safety sitter orders however pt has not required a sitter since at least 0700 Saturday 01/22/18. Safety needs have been met with safety rounding on pt.

## 2018-01-24 NOTE — Progress Notes (Signed)
Physical Therapy Treatment Patient Details Name: Amanda Atkins MRN: [id number] DOB: 1925-02-13 Today's Date: 01/24/2018    History of Present Illness Pt. with PMH of dementia, A. fib; admitted on 01/18/2018, presented with complaint of confusion, was found to have acute encephalopathy.    PT Comments    Pt was able to walk further down the hallway today with less assistance, remains very confused, impulsive and needs assist/cues to consistently and safely use RW.  PT will continue to follow acutely for safe mobility progression  Follow Up Recommendations  SNF     Equipment Recommendations  None recommended by PT    Recommendations for Other Services   NA     Precautions / Restrictions Precautions Precautions: Fall Precaution Comments: very unsteady on her feet and impulsive with poor safety awareness.     Mobility  Bed Mobility Overal bed mobility: Needs Assistance Bed Mobility: Supine to Sit     Supine to sit: Min assist     General bed mobility comments: Min hand held assist to pull up to sitting EOB, pt managing her own legs.  Transfers Overall transfer level: Needs assistance Equipment used: Rolling walker (2 wheeled) Transfers: Sit to/from Stand Sit to Stand: Min assist         General transfer comment: Min assist to support trunk during transitions to stand.   Ambulation/Gait Ambulation/Gait assistance: Min assist Gait Distance (Feet): 150 Feet Assistive device: Rolling walker (2 wheeled) Gait Pattern/deviations: Step-through pattern;Shuffle;Trunk flexed     General Gait Details: Pt with flexed trunk posture, RW too far ahead of her, tendancy to have LOB forward (trunk forward of her feet) and pt parks RW in the middle of the hallway and then starts to walk away from it.  Assist and max cues needed to keep hands on RW and continue to use it while we are walking.           Balance Overall balance assessment: Needs assistance Sitting-balance  support: Feet supported;Bilateral upper extremity supported Sitting balance-Leahy Scale: Fair     Standing balance support: Bilateral upper extremity supported Standing balance-Leahy Scale: Poor Standing balance comment: min assist in standing with support of Rw.                             Cognition Arousal/Alertness: Awake/alert Behavior During Therapy: Impulsive Overall Cognitive Status: History of cognitive impairments - at baseline                                        Exercises General Exercises - Lower Extremity Ankle Circles/Pumps: AROM;Both;20 reps Long Arc Quad: AROM;Both;10 reps Hip ABduction/ADduction: AROM;Both;10 reps Hip Flexion/Marching: AROM;Both;10 reps        Pertinent Vitals/Pain Pain Assessment: Faces Pain Score: 0-No pain       Prior Function            PT Goals (current goals can now be found in the care plan section) Acute Rehab PT Goals Patient Stated Goal: to go home Progress towards PT goals: Progressing toward goals    Frequency    Min 2X/week      PT Plan Current plan remains appropriate      AM-PAC PT "6 Clicks" Mobility   Outcome Measure  Help needed turning from your back to your side while in a flat bed without using bedrails?: A Little  Help needed moving from lying on your back to sitting on the side of a flat bed without using bedrails?: A Little Help needed moving to and from a bed to a chair (including a wheelchair)?: A Little Help needed standing up from a chair using your arms (e.g., wheelchair or bedside chair)?: A Little Help needed to walk in hospital room?: A Little Help needed climbing 3-5 steps with a railing? : A Lot 6 Click Score: 17    End of Session Equipment Utilized During Treatment: Gait belt Activity Tolerance: Patient tolerated treatment well Patient left: in chair;with call bell/phone within reach;with family/visitor present   PT Visit Diagnosis: Muscle weakness  (generalized) (M62.81);Unsteadiness on feet (R26.81)     Time: 1135-1150 PT Time Calculation (min) (ACUTE ONLY): 15 min  Charges:  $Gait Training: 8-22 mins                    Ivanna Kocak B. Myleah Cavendish, PT, DPT  Acute Rehabilitation [phone] pager #[phone] office   01/24/2018, 5:19 PM

## 2018-01-24 NOTE — Discharge Summary (Signed)
[facility] Discharge Summary   Patient: Amanda Atkins ZOX:[id number]   PCP: Margaree Mackintosh, MD DOB: 04-29-1924   Date of admission: 01/18/2018   Date of discharge:  01/24/2018    Discharge Diagnoses:   Principal Problem:   Acute encephalopathy Active Problems:   PAF (paroxysmal atrial fibrillation) (HCC)   Dementia with behavioral disturbance (HCC)   Goals of care, counseling/discussion   Palliative care by specialist   DNR (do not resuscitate) discussion   Admitted From: home Disposition:  SNF  Recommendations for Outpatient Follow-up:  1. Follow-up with PCP in 1 week. 2. Please continue to engage with palliative care outpatient at SNF for continued discussion of goals of care   Contact information for follow-up providers    Baxley, Luanna Cole, MD. Schedule an appointment as soon as possible for a visit in 1 week(s).   Specialty:  Internal Medicine Contact information: [address] [place] [address] [phone]            Contact information for after-discharge care    Destination    [facility] .   Service:  Skilled Nursing Contact information: [address] [place] [address] [phone]                 Diet recommendation: Dysphagia 3 (mechanical soft);Thin liquid Liquids provided via: Cup;Straw Medication Administration: Whole meds with puree Supervision: Staff to assist with self feeding Compensations: Minimize environmental distractions  Activity: The patient is advised to gradually reintroduce usual activities.  Discharge Condition: good  Code Status: full code  History of present illness: As per the H and P dictated on admission, " Amanda Atkins is a 84 y.o. female with medical history significant of dementia and atrial fibrillation not on anticoagulation due to risk of falls presenting with AMS.  She was unaccompanied and unable to effectively answer questions at the time of  my evaluation.  She was previously hospitalized with HCAP and discharged 1-2 weeks ago.  She returned to the ER with AMS and remained there awaiting disposition for many days.  Patient was transported to [facility] last night and was brought back with AMS several hours later due to being "unresponsive" at the facility.  Given her prolonged ER stay, the suggestion was made to obtain CTA chest prior to transport back to [facility].  Due to difficulty obtaining IV access, this still has not taken place.  "  Hospital Course:  Summary of her active problems in the hospital is as following. 1.  Acute toxic encephalopathy.  Presents with an episode of unresponsiveness at the facility. Initial concern was for sepsis infection although I do not think that the patient actually has an active infection. Urine culture is unremarkable, chest x-ray unremarkable. Clinically no evidence of infection as well.  CTA negative for PE. MRI brain negative for any acute abnormality. Metabolic work-up is also negative. EEG negative for any active seizures and shows metabolic encephalopathy pattern Orthostatic hypotension cannot be ruled out in the setting of patient having PAF as well as dementia.  Telemetry shows brief pauses.   EP consult for possible etiology of tachybradycardia syndrome causing unresponsive events although they do not think that the pauses significant enough to cause any unresponsive episode.  PT OT consulted.  The daughter informed me that she has been giving the patient her home Xanax here in the hospital on her own for last 2 days.  Inform her that I would prefer not to use  Xanax and I would definitely prefer her not to give any medication that is not prescribed by a provider here in the hospital.  2.  Dementia. Appears moderate to severe. Continue Seroquel.  No agitation reported in the hospital  3.  Paroxysmal A. fib. Patient is unable to take Cardizem sustained-release. Patient  was on oral Cardizem tablet, although due to significant bradycardic events with pauses it is currently discontinued. Continue PRN Cardizem 30 mg for tachycardia. Not on antiregulation due to recurrent falls.  4.  Goals of care discussion. Family wants to maintain full code for now. Highly appreciate palliative care consultation. Patient and family will benefit from outpatient palliative care at [facility].  All other chronic medical condition were stable during the hospitalization.  Patient was seen by physical therapy, who recommended SNF, which was arranged by Child psychotherapist and case Production designer, theatre/television/film. On the day of the discharge the patient's vitals were stable , and no other acute medical condition were reported by patient. the patient was felt safe to be discharge at SNF with therapy.  Consultants: Palliative care  Procedures: EEG  DISCHARGE MEDICATION: Allergies as of 01/24/2018      Reactions   Gluten Meal Diarrhea   Olanzapine Other (See Comments)   Anxious, irritable, wild looking eyes      Medication List    STOP taking these medications   ALPRAZolam 0.5 MG tablet Commonly known as:  XANAX   diltiazem 180 MG 24 hr capsule Commonly known as:  CARDIZEM CD   ibuprofen 200 MG tablet Commonly known as:  ADVIL,MOTRIN   magnesium gluconate 30 MG tablet Commonly known as:  MAGONATE     TAKE these medications   diltiazem 30 MG tablet Commonly known as:  CARDIZEM Take 1 tablet (30 mg total) by mouth every 6 (six) hours as needed (atrial fibrillation with RVR heart rate>120.).   furosemide 20 MG tablet Commonly known as:  LASIX Take 1 tablet (20 mg total) by mouth every Sunday for 24 days. Start taking on:  01/30/2018 What changed:  when to take this   potassium chloride SA 20 MEQ tablet Commonly known as:  K-DUR,KLOR-CON Take 1 tablet (20 mEq total) by mouth every Sunday. Start taking on:  01/30/2018 What changed:  when to take this   QUEtiapine 25 MG tablet Commonly known  as:  SEROQUEL Take 0.5 tablets (12.5 mg total) by mouth at bedtime.   RESOURCE THICKENUP CLEAR Powd Dysphagia 3 (Mech soft);Nectar-thick liquid   Liquid Administration via: Cup;Straw Medication Administration: Whole meds with puree Supervision: Staff to assist with self feeding;Full supervision/cueing for compensatory strategies Compensations: Minimize environmental distractions;Slow rate;Small sips/bites Postural Changes: Seated upright at 90 degrees;Remain upright for at least 30 minutes after po intake What changed:    how to take this  when to take this   senna-docusate 8.6-50 MG tablet Commonly known as:  Senokot-S Take 1 tablet by mouth at bedtime. What changed:    when to take this  reasons to take this      Allergies  Allergen Reactions  . Gluten Meal Diarrhea  . Olanzapine Other (See Comments)    Anxious, irritable, wild looking eyes   Discharge Instructions    DIET DYS 3   Complete by:  As directed    Fluid consistency:  Thin   Diet recommendations: Dysphagia 3 (mechanical soft);Thin liquid Liquids provided via: Cup;Straw Medication Administration: Whole meds with puree Supervision: Staff to assist with self feeding Compensations: Minimize environmental distractions   Discharge instructions  Complete by:  As directed    It is important that you read following instructions as well as go over your medication list with RN to help you understand your care after this hospitalization.  Discharge Instructions: Please follow-up with PCP in one week  Please request your primary care physician to go over all Hospital Tests and Procedure/Radiological results at the follow up,  Please get all Hospital records sent to your PCP by signing hospital release before you go home.   Do not take more than prescribed Pain, Sleep and Anxiety Medications. You were cared for by a hospitalist during your hospital stay. If you have any questions about your discharge medications  or the care you received while you were in the hospital after you are discharged, you can call the unit you were admitted to and ask to speak with the hospitalist on call if the hospitalist that took care of you is not available.  Once you are discharged, your primary care physician will handle any further medical issues. Please note that NO REFILLS for any discharge medications will be authorized once you are discharged, as it is imperative that you return to your primary care physician (or establish a relationship with a primary care physician if you do not have one) for your aftercare needs so that they can reassess your need for medications and monitor your lab values. You Must read complete instructions/literature along with all the possible adverse reactions/side effects for all the Medicines you take and that have been prescribed to you. Take any new Medicines after you have completely understood and accept all the possible adverse reactions/side effects. Wear Seat belts while driving. If you have smoked or chewed Tobacco in the last 2 yrs please stop smoking and/or stop any Recreational drug use.   Increase activity slowly   Complete by:  As directed      Discharge Exam: Filed Weights   01/18/18 1419  Weight: 47.6 kg   Vitals:   01/24/18 0404 01/24/18 0833  BP: 131/80 127/84  Pulse: 89 77  Resp: 18 18  Temp: (!) 97.5 F (36.4 C) 97.7 F (36.5 C)  SpO2: 99% 100%   General: Appear in no distress, no Rash; Oral Mucosa moist. Cardiovascular: S1 and S2 Present, nop Murmur, no JVD Respiratory: Bilateral Air entry present and Clear to Auscultation, no Crackles, no wheezes Abdomen: Bowel Sound present, Soft and no tenderness Extremities: no Pedal edema, no calf tenderness Neurology: Grossly no focal neuro deficit.  The results of significant diagnostics from this hospitalization (including imaging, microbiology, ancillary and laboratory) are listed below for reference.     Significant Diagnostic Studies: Dg Chest 2 View  Result Date: 01/18/2018 CLINICAL DATA:  History of atrial fibrillation and CHF. EXAM: CHEST - 2 VIEW COMPARISON:  01/14/2018 FINDINGS: Cardiomegaly, vascular congestion. Focal left lower lobe atelectasis or infiltrate, worsening since prior study. No visible significant effusions or acute bony abnormality. IMPRESSION: Cardiomegaly, vascular congestion. Left lower lobe atelectasis or infiltrate/pneumonia. Electronically Signed   By: Charlett NoseKevin  Dover M.D.   On: 01/18/2018 02:24   Dg Chest 2 View  Result Date: 01/09/2018 CLINICAL DATA:  Chest pain and shortness of breath. EXAM: CHEST - 2 VIEW COMPARISON:  Radiographs 01/06/2018 and 12/17/2017. Older comparison studies are also correlated. FINDINGS: Stable cardiomegaly and aortic atherosclerosis. There is persistent density at the right lung base with blunting of the right costophrenic angle on the frontal examination, suspicious for a right pleural effusion. There are diffuse bilateral pulmonary opacities which  may reflect edema based the appearance on the frontal examination. These appear slightly more heterogeneous on the lateral view and could reflect bronchopneumonia. There is no consolidation or definite lobar collapse. Thoracolumbar compression deformities are grossly stable. IMPRESSION: Persistent diffuse bilateral airspace opacities and probable right pleural effusion, similar to recent prior study allowing for technical differences. Findings may reflect edema or pneumonia. Electronically Signed   By: Carey Bullocks M.D.   On: 01/09/2018 15:23   Dg Chest 2 View  Result Date: 01/06/2018 CLINICAL DATA:  Shortness of breath and cough. Concern for pneumonia EXAM: CHEST - 2 VIEW COMPARISON:  12/17/2017 FINDINGS: Chronic cardiomegaly. Low volume chest with band of opacity in the right middle lobe. There is also opacification at the lateral right costophrenic sulcus with no fluid seen on the lateral view.  No air bronchogram, edema, effusion, or pneumothorax. Remote compression fractures. IMPRESSION: Low volume chest with right middle lobe collapse. Based on the history there may be underlying airway infection/bronchopneumonia. Electronically Signed   By: Marnee Spring M.D.   On: 01/06/2018 12:58   Ct Head Wo Contrast  Result Date: 01/14/2018 CLINICAL DATA:  Slurred speech, facial droop and lethargy for 1-1/2 days. EXAM: CT HEAD WITHOUT CONTRAST TECHNIQUE: Contiguous axial images were obtained from the base of the skull through the vertex without intravenous contrast. COMPARISON:  Head CT scan 12/24/2017. FINDINGS: Brain: No evidence of acute infarction, hemorrhage, hydrocephalus, extra-axial collection or mass lesion/mass effect. Atrophy and chronic microvascular ischemic change are noted. Vascular: Atherosclerosis.  Otherwise unremarkable. Skull: Intact.  No focal lesion. Sinuses/Orbits: No acute abnormality.  Status post cataract surgery. Other: None. IMPRESSION: No acute abnormality. Atrophy and chronic microvascular ischemic change. Atherosclerosis. Electronically Signed   By: Drusilla Kanner M.D.   On: 01/14/2018 17:28   Ct Angio Chest Pe W/cm &/or Wo Cm  Result Date: 01/18/2018 CLINICAL DATA:  84 year old female with acute shortness of breath. EXAM: CT ANGIOGRAPHY CHEST WITH CONTRAST TECHNIQUE: Multidetector CT imaging of the chest was performed using the standard protocol during bolus administration of intravenous contrast. Multiplanar CT image reconstructions and MIPs were obtained to evaluate the vascular anatomy. CONTRAST:  36mL ISOVUE-370 IOPAMIDOL (ISOVUE-370) INJECTION 76% COMPARISON:  01/18/2018 and prior chest radiographs FINDINGS: Cardiovascular: This is a technically adequate study. Respiratory motion artifact slightly decreases sensitivity in the LOWER lungs. No pulmonary emboli are identified. Cardiomegaly and small pericardial effusion noted. Coronary artery and aortic atherosclerotic  calcifications are identified. Ectatic ascending thoracic aorta noted without evidence of aneurysm. Mediastinum/Nodes: No enlarged mediastinal, hilar, or axillary lymph nodes. Thyroid gland, trachea, and esophagus demonstrate no significant findings. Lungs/Pleura: Central ground-glass opacities and mild bilateral interstitial/interlobular septal thickening noted which suggests edema. Trace bilateral pleural effusions are noted. No evidence of definite airspace disease, consolidation, mass or pneumothorax. Upper Abdomen: No acute abnormality Musculoskeletal: No acute or suspicious bony abnormalities. Thoracic compression fractures are unchanged from prior radiographs Review of the MIP images confirms the above findings. IMPRESSION: 1. No evidence of pulmonary emboli. 2. Central ground-glass opacities and mild bilateral interstitial/interlobular septal thickening, nonspecific but suggestive of edema. Trace bilateral pleural effusions and small pericardial effusion. 3. Cardiomegaly and coronary artery disease. 4.  Aortic Atherosclerosis (ICD10-I70.0). Electronically Signed   By: Harmon Pier M.D.   On: 01/18/2018 20:08   Mr Brain Wo Contrast  Result Date: 01/19/2018 CLINICAL DATA:  84 y/o  F; altered mental status. EXAM: MRI HEAD WITHOUT CONTRAST TECHNIQUE: Axial DWI and axial T2 FLAIR propeller sequences were acquired. Patient was unable to  continue additional sequences were not acquired. COMPARISON:  01/14/2018 CT head. FINDINGS: Moderate motion artifact. No reduced diffusion to suggest acute or early subacute infarction. No extra-axial collection, hydrocephalus, mass effect, or herniation identified the diffusion and FLAIR sequences. Patchy nonspecific T2 FLAIR hyperintensities in subcortical and periventricular white matter are compatible with moderate chronic microvascular ischemic changes for age. Moderate volume loss of the brain. Mucous retention cyst in right posterior ethmoid air cells. Left mastoid air  cell opacification. IMPRESSION: 1. Axial DWI and axial T2 FLAIR sequences were acquired. Moderate motion artifact. 2. No acute stroke or mass effect. 3. Moderate chronic microvascular ischemic changes and volume loss of the brain. Electronically Signed   By: Mitzi Hansen M.D.   On: 01/19/2018 13:38   Dg Chest Port 1 View  Result Date: 01/14/2018 CLINICAL DATA:  Weakness, pneumonia EXAM: PORTABLE CHEST 1 VIEW COMPARISON:  01/09/2018 FINDINGS: Cardiomegaly with vascular congestion. No overt edema. No confluent opacities or effusions. No acute bony abnormality. IMPRESSION: Cardiomegaly, vascular congestion. Electronically Signed   By: Charlett Nose M.D.   On: 01/14/2018 14:22   Dg Abd Portable 1 View  Result Date: 01/09/2018 CLINICAL DATA:  Generalized abdominal pain for 1 week EXAM: PORTABLE ABDOMEN - 1 VIEW COMPARISON:  Lumbar spine radiographs from 12/08/2017 FINDINGS: Indistinct left hemidiaphragm, possibly from a left lower lobe airspace opacity. Mild blunting of the right lateral costophrenic angle. Gas and likely formed stool in the colon.  No dilated bowel. Aortoiliac atherosclerotic vascular disease. Lumbar spondylosis and degenerative disc disease. Old compression fracture at L1. IMPRESSION: 1. Unremarkable bowel gas pattern. 2. Indistinct left hemidiaphragm, cannot exclude left lower lobe consolidation. 3. Mild blunting of the right lateral costophrenic angle suggesting a small right pleural effusion. 4. Lumbar spondylosis and degenerative disc disease. Old compression fracture at L1. 5.  Aortic Atherosclerosis (ICD10-I70.0). Electronically Signed   By: Gaylyn Rong M.D.   On: 01/09/2018 16:37    Microbiology: Recent Results (from the past 240 hour(s))  Blood culture (routine x 2)     Status: None   Collection Time: 01/14/18  4:21 PM  Result Value Ref Range Status   Specimen Description BLOOD RIGHT HAND  Final   Special Requests   Final    BOTTLES DRAWN AEROBIC AND  ANAEROBIC Blood Culture results may not be optimal due to an inadequate volume of blood received in culture bottles   Culture   Final    NO GROWTH 5 DAYS Performed at [facility], [address] [place], [place] [address]    Report Status 01/19/2018 FINAL  Final  Blood culture (routine x 2)     Status: None   Collection Time: 01/14/18  4:59 PM  Result Value Ref Range Status   Specimen Description BLOOD RIGHT FOREARM  Final   Special Requests   Final    BOTTLES DRAWN AEROBIC AND ANAEROBIC Blood Culture adequate volume   Culture   Final    NO GROWTH 5 DAYS Performed at [facility], [address] [place], [place] [address]    Report Status 01/19/2018 FINAL  Final  Urine culture     Status: None   Collection Time: 01/18/18  1:43 AM  Result Value Ref Range Status   Specimen Description URINE, CATHETERIZED  Final   Special Requests NONE  Final   Culture   Final    NO GROWTH Performed at [facility], [address], [place], [place] [address]    Report Status  01/19/2018 FINAL  Final  Culture, blood (routine x 2) Call MD if unable to obtain prior to antibiotics being given     Status: None   Collection Time: 01/18/18  9:00 AM  Result Value Ref Range Status   Specimen Description BLOOD LEFT ARM  Final   Special Requests   Final    BOTTLES DRAWN AEROBIC AND ANAEROBIC Blood Culture adequate volume   Culture   Final    NO GROWTH 5 DAYS Performed at [facility], [address], [place], [place] [address]    Report Status 01/23/2018 FINAL  Final  Culture, blood (routine x 2) Call MD if unable to obtain prior to antibiotics being given     Status: None   Collection Time: 01/18/18 11:48 AM  Result Value Ref Range Status   Specimen Description BLOOD LEFT ARM  Final   Special Requests   Final    BOTTLES DRAWN AEROBIC ONLY Blood Culture results may not be optimal due to an inadequate volume of blood received in culture bottles   Culture   Final    NO GROWTH 5  DAYS Performed at [facility], [address] [place], [place] [address]    Report Status 01/23/2018 FINAL  Final     Labs: CBC: Recent Labs  Lab 01/18/18 0144 01/19/18 0540 01/22/18 0306  WBC 10.0 6.1 5.4  NEUTROABS  --  3.5  --   HGB 12.5 11.6* 11.9*  HCT 40.4 36.2 38.7  MCV 89.4 88.1 88.0  PLT 245 269 239   Basic Metabolic Panel: Recent Labs  Lab 01/18/18 0144 01/19/18 0540 01/20/18 0337 01/22/18 0306  NA 141 141  --  141  K 4.1 3.8  --  3.5  CL 105 107  --  107  CO2 26 21*  --  23  GLUCOSE 153* 105*  --  102*  BUN 19 11  --  14  CREATININE 1.09* 0.97  --  0.89  CALCIUM 9.6 9.0  --  9.4  MG  --   --  1.9  --    Liver Function Tests: Recent Labs  Lab 01/18/18 0144  AST 33  ALT 24  ALKPHOS 77  BILITOT 0.8  PROT 6.7  ALBUMIN 3.5   No results for input(s): LIPASE, AMYLASE in the last 168 hours. Recent Labs  Lab 01/19/18 1124  AMMONIA 33   Cardiac Enzymes: No results for input(s): CKTOTAL, CKMB, CKMBINDEX, TROPONINI in the last 168 hours. BNP (last 3 results) Recent Labs    01/09/18 1616 01/11/18 0239 01/18/18 0144  BNP 267.3* 131.6* 161.0*   CBG: No results for input(s): GLUCAP in the last 168 hours. Time spent: 35 minutes  Signed:  Lynden Oxford  [facility]  01/24/2018  , 2:34 PM

## 2018-01-24 NOTE — Care Management Important Message (Signed)
Important Message  Patient Details  Name: Amanda Atkins MRN: [id number] Date of Birth: [birth year]   Medicare Important Message Given:  Yes    Malvin Morrish 01/24/2018, 4:18 PM

## 2018-01-25 ENCOUNTER — Ambulatory Visit (HOSPITAL_COMMUNITY): Payer: Medicare Other | Admitting: Psychiatry

## 2018-01-25 NOTE — Discharge Summary (Signed)
[facility] Discharge Summary   Patient: Amanda Atkins ZOX:[id number]   PCP: Margaree Mackintosh, MD DOB: 1924/08/28   Date of admission: 01/18/2018   Date of discharge:  01/25/2018    Discharge Diagnoses:   Principal Problem:   Acute encephalopathy Active Problems:   PAF (paroxysmal atrial fibrillation) (HCC)   Dementia with behavioral disturbance (HCC)   Goals of care, counseling/discussion   Palliative care by specialist   DNR (do not resuscitate) discussion  Admitted From: home Disposition:  SNF  Recommendations for Outpatient Follow-up:  1. Follow-up with PCP in 1 week. 2. Please continue to engage with palliative care outpatient at SNF for continued discussion of goals of care   Contact information for follow-up providers    Baxley, Luanna Cole, MD. Schedule an appointment as soon as possible for a visit in 1 week(s).   Specialty:  Internal Medicine Contact information: [address] [place] [address] [phone]            Contact information for after-discharge care    Destination    [facility] .   Service:  Skilled Nursing Contact information: [address] [place] [address] [phone]                 Diet recommendation: Dysphagia 3 (mechanical soft);Thin liquid Liquids provided via: Cup;Straw Medication Administration: Whole meds with puree Supervision: Staff to assist with self feeding Compensations: Minimize environmental distractions  Activity: The patient is advised to gradually reintroduce usual activities.  Discharge Condition: good  Code Status: full code  History of present illness: As per the H and P dictated on admission, " Amanda Atkins is a 84 y.o. female with medical history significant of dementia and atrial fibrillation not on anticoagulation due to risk of falls presenting with AMS.  She was unaccompanied and unable to effectively answer questions at the time of my  evaluation.  She was previously hospitalized with HCAP and discharged 1-2 weeks ago.  She returned to the ER with AMS and remained there awaiting disposition for many days.  Patient was transported to [facility] last night and was brought back with AMS several hours later due to being "unresponsive" at the facility.  Given her prolonged ER stay, the suggestion was made to obtain CTA chest prior to transport back to [facility].  Due to difficulty obtaining IV access, this still has not taken place.  "  Hospital Course:  Summary of her active problems in the hospital is as following. 1.  Acute toxic encephalopathy.  Presents with an episode of unresponsiveness at the facility. Initial concern was for sepsis infection although I do not think that the patient actually has an active infection. Urine culture is unremarkable, chest x-ray unremarkable. Clinically no evidence of infection as well.  CTA negative for PE. MRI brain negative for any acute abnormality. Metabolic work-up is also negative. EEG negative for any active seizures and shows metabolic encephalopathy pattern Orthostatic hypotension cannot be ruled out in the setting of patient having PAF as well as dementia.  Telemetry shows brief pauses.   EP consult for possible etiology of tachybradycardia syndrome causing unresponsive events although they do not think that the pauses significant enough to cause any unresponsive episode.  PT OT consulted.  The daughter informed me that she has been giving the patient her home Xanax here in the hospital on her own for last 2 days.  Inform her that I would prefer not to use Xanax  and I would definitely prefer her not to give any medication that is not prescribed by a provider here in the hospital.  2.  Dementia. Appears moderate to severe. Continue Seroquel.  No agitation reported in the hospital  3.  Paroxysmal A. fib. Patient is unable to take Cardizem sustained-release. Patient was  on oral Cardizem tablet, although due to significant bradycardic events with pauses it is currently discontinued. Continue PRN Cardizem 30 mg for tachycardia. Not on antiregulation due to recurrent falls.  4.  Goals of care discussion. Family wants to maintain full code for now. Highly appreciate palliative care consultation. Patient and family will benefit from outpatient palliative care at [facility].  All other chronic medical condition were stable during the hospitalization.  Patient was seen by physical therapy, who recommended SNF, which was arranged by Child psychotherapist and case Production designer, theatre/television/film. On the day of the discharge the patient's vitals were stable , and no other acute medical condition were reported by patient. the patient was felt safe to be discharge at SNF with therapy.  Consultants: Palliative care  Procedures: EEG  DISCHARGE MEDICATION: Allergies as of 01/25/2018      Reactions   Gluten Meal Diarrhea   Olanzapine Other (See Comments)   Anxious, irritable, wild looking eyes      Medication List    STOP taking these medications   ALPRAZolam 0.5 MG tablet Commonly known as:  XANAX   diltiazem 180 MG 24 hr capsule Commonly known as:  CARDIZEM CD   ibuprofen 200 MG tablet Commonly known as:  ADVIL,MOTRIN   magnesium gluconate 30 MG tablet Commonly known as:  MAGONATE     TAKE these medications   diltiazem 30 MG tablet Commonly known as:  CARDIZEM Take 1 tablet (30 mg total) by mouth every 6 (six) hours as needed (atrial fibrillation with RVR heart rate>120.).   furosemide 20 MG tablet Commonly known as:  LASIX Take 1 tablet (20 mg total) by mouth every Sunday for 24 days. Start taking on:  01/30/2018 What changed:  when to take this   potassium chloride SA 20 MEQ tablet Commonly known as:  K-DUR,KLOR-CON Take 1 tablet (20 mEq total) by mouth every Sunday. Start taking on:  01/30/2018 What changed:  when to take this   QUEtiapine 25 MG tablet Commonly known as:   SEROQUEL Take 0.5 tablets (12.5 mg total) by mouth at bedtime.   RESOURCE THICKENUP CLEAR Powd Dysphagia 3 (Mech soft);Nectar-thick liquid   Liquid Administration via: Cup;Straw Medication Administration: Whole meds with puree Supervision: Staff to assist with self feeding;Full supervision/cueing for compensatory strategies Compensations: Minimize environmental distractions;Slow rate;Small sips/bites Postural Changes: Seated upright at 90 degrees;Remain upright for at least 30 minutes after po intake What changed:    how to take this  when to take this   senna-docusate 8.6-50 MG tablet Commonly known as:  Senokot-S Take 1 tablet by mouth at bedtime. What changed:    when to take this  reasons to take this      Allergies  Allergen Reactions  . Gluten Meal Diarrhea  . Olanzapine Other (See Comments)    Anxious, irritable, wild looking eyes   Discharge Instructions    DIET DYS 3   Complete by:  As directed    Fluid consistency:  Thin   Diet recommendations: Dysphagia 3 (mechanical soft);Thin liquid Liquids provided via: Cup;Straw Medication Administration: Whole meds with puree Supervision: Staff to assist with self feeding Compensations: Minimize environmental distractions   Discharge instructions  Complete by:  As directed    It is important that you read following instructions as well as go over your medication list with RN to help you understand your care after this hospitalization.  Discharge Instructions: Please follow-up with PCP in one week  Please request your primary care physician to go over all Hospital Tests and Procedure/Radiological results at the follow up,  Please get all Hospital records sent to your PCP by signing hospital release before you go home.   Do not take more than prescribed Pain, Sleep and Anxiety Medications. You were cared for by a hospitalist during your hospital stay. If you have any questions about your discharge medications or  the care you received while you were in the hospital after you are discharged, you can call the unit you were admitted to and ask to speak with the hospitalist on call if the hospitalist that took care of you is not available.  Once you are discharged, your primary care physician will handle any further medical issues. Please note that NO REFILLS for any discharge medications will be authorized once you are discharged, as it is imperative that you return to your primary care physician (or establish a relationship with a primary care physician if you do not have one) for your aftercare needs so that they can reassess your need for medications and monitor your lab values. You Must read complete instructions/literature along with all the possible adverse reactions/side effects for all the Medicines you take and that have been prescribed to you. Take any new Medicines after you have completely understood and accept all the possible adverse reactions/side effects. Wear Seat belts while driving. If you have smoked or chewed Tobacco in the last 2 yrs please stop smoking and/or stop any Recreational drug use.   Increase activity slowly   Complete by:  As directed      Discharge Exam: Filed Weights   01/18/18 1419  Weight: 47.6 kg   Vitals:   01/24/18 2322 01/25/18 0733  BP: 132/81 137/78  Pulse: 82 81  Resp: 10 16  Temp: (!) 97.5 F (36.4 C) 97.9 F (36.6 C)  SpO2: 93% 100%   General: Appear in no distress, no Rash; Oral Mucosa moist. Cardiovascular: S1 and S2 Present, nop Murmur, no JVD Respiratory: Bilateral Air entry present and Clear to Auscultation, no Crackles, no wheezes Abdomen: Bowel Sound present, Soft and no tenderness Extremities: no Pedal edema, no calf tenderness Neurology: Grossly no focal neuro deficit.  The results of significant diagnostics from this hospitalization (including imaging, microbiology, ancillary and laboratory) are listed below for reference.    Significant  Diagnostic Studies: Dg Chest 2 View  Result Date: 01/18/2018 CLINICAL DATA:  History of atrial fibrillation and CHF. EXAM: CHEST - 2 VIEW COMPARISON:  01/14/2018 FINDINGS: Cardiomegaly, vascular congestion. Focal left lower lobe atelectasis or infiltrate, worsening since prior study. No visible significant effusions or acute bony abnormality. IMPRESSION: Cardiomegaly, vascular congestion. Left lower lobe atelectasis or infiltrate/pneumonia. Electronically Signed   By: Charlett NoseKevin  Dover M.D.   On: 01/18/2018 02:24   Dg Chest 2 View  Result Date: 01/09/2018 CLINICAL DATA:  Chest pain and shortness of breath. EXAM: CHEST - 2 VIEW COMPARISON:  Radiographs 01/06/2018 and 12/17/2017. Older comparison studies are also correlated. FINDINGS: Stable cardiomegaly and aortic atherosclerosis. There is persistent density at the right lung base with blunting of the right costophrenic angle on the frontal examination, suspicious for a right pleural effusion. There are diffuse bilateral pulmonary opacities which  may reflect edema based the appearance on the frontal examination. These appear slightly more heterogeneous on the lateral view and could reflect bronchopneumonia. There is no consolidation or definite lobar collapse. Thoracolumbar compression deformities are grossly stable. IMPRESSION: Persistent diffuse bilateral airspace opacities and probable right pleural effusion, similar to recent prior study allowing for technical differences. Findings may reflect edema or pneumonia. Electronically Signed   By: Carey Bullocks M.D.   On: 01/09/2018 15:23   Dg Chest 2 View  Result Date: 01/06/2018 CLINICAL DATA:  Shortness of breath and cough. Concern for pneumonia EXAM: CHEST - 2 VIEW COMPARISON:  12/17/2017 FINDINGS: Chronic cardiomegaly. Low volume chest with band of opacity in the right middle lobe. There is also opacification at the lateral right costophrenic sulcus with no fluid seen on the lateral view. No air  bronchogram, edema, effusion, or pneumothorax. Remote compression fractures. IMPRESSION: Low volume chest with right middle lobe collapse. Based on the history there may be underlying airway infection/bronchopneumonia. Electronically Signed   By: Marnee Spring M.D.   On: 01/06/2018 12:58   Ct Head Wo Contrast  Result Date: 01/14/2018 CLINICAL DATA:  Slurred speech, facial droop and lethargy for 1-1/2 days. EXAM: CT HEAD WITHOUT CONTRAST TECHNIQUE: Contiguous axial images were obtained from the base of the skull through the vertex without intravenous contrast. COMPARISON:  Head CT scan 12/24/2017. FINDINGS: Brain: No evidence of acute infarction, hemorrhage, hydrocephalus, extra-axial collection or mass lesion/mass effect. Atrophy and chronic microvascular ischemic change are noted. Vascular: Atherosclerosis.  Otherwise unremarkable. Skull: Intact.  No focal lesion. Sinuses/Orbits: No acute abnormality.  Status post cataract surgery. Other: None. IMPRESSION: No acute abnormality. Atrophy and chronic microvascular ischemic change. Atherosclerosis. Electronically Signed   By: Drusilla Kanner M.D.   On: 01/14/2018 17:28   Ct Angio Chest Pe W/cm &/or Wo Cm  Result Date: 01/18/2018 CLINICAL DATA:  84 year old female with acute shortness of breath. EXAM: CT ANGIOGRAPHY CHEST WITH CONTRAST TECHNIQUE: Multidetector CT imaging of the chest was performed using the standard protocol during bolus administration of intravenous contrast. Multiplanar CT image reconstructions and MIPs were obtained to evaluate the vascular anatomy. CONTRAST:  36mL ISOVUE-370 IOPAMIDOL (ISOVUE-370) INJECTION 76% COMPARISON:  01/18/2018 and prior chest radiographs FINDINGS: Cardiovascular: This is a technically adequate study. Respiratory motion artifact slightly decreases sensitivity in the LOWER lungs. No pulmonary emboli are identified. Cardiomegaly and small pericardial effusion noted. Coronary artery and aortic atherosclerotic  calcifications are identified. Ectatic ascending thoracic aorta noted without evidence of aneurysm. Mediastinum/Nodes: No enlarged mediastinal, hilar, or axillary lymph nodes. Thyroid gland, trachea, and esophagus demonstrate no significant findings. Lungs/Pleura: Central ground-glass opacities and mild bilateral interstitial/interlobular septal thickening noted which suggests edema. Trace bilateral pleural effusions are noted. No evidence of definite airspace disease, consolidation, mass or pneumothorax. Upper Abdomen: No acute abnormality Musculoskeletal: No acute or suspicious bony abnormalities. Thoracic compression fractures are unchanged from prior radiographs Review of the MIP images confirms the above findings. IMPRESSION: 1. No evidence of pulmonary emboli. 2. Central ground-glass opacities and mild bilateral interstitial/interlobular septal thickening, nonspecific but suggestive of edema. Trace bilateral pleural effusions and small pericardial effusion. 3. Cardiomegaly and coronary artery disease. 4.  Aortic Atherosclerosis (ICD10-I70.0). Electronically Signed   By: Harmon Pier M.D.   On: 01/18/2018 20:08   Mr Brain Wo Contrast  Result Date: 01/19/2018 CLINICAL DATA:  84 y/o  F; altered mental status. EXAM: MRI HEAD WITHOUT CONTRAST TECHNIQUE: Axial DWI and axial T2 FLAIR propeller sequences were acquired. Patient was unable to  continue additional sequences were not acquired. COMPARISON:  01/14/2018 CT head. FINDINGS: Moderate motion artifact. No reduced diffusion to suggest acute or early subacute infarction. No extra-axial collection, hydrocephalus, mass effect, or herniation identified the diffusion and FLAIR sequences. Patchy nonspecific T2 FLAIR hyperintensities in subcortical and periventricular white matter are compatible with moderate chronic microvascular ischemic changes for age. Moderate volume loss of the brain. Mucous retention cyst in right posterior ethmoid air cells. Left mastoid air  cell opacification. IMPRESSION: 1. Axial DWI and axial T2 FLAIR sequences were acquired. Moderate motion artifact. 2. No acute stroke or mass effect. 3. Moderate chronic microvascular ischemic changes and volume loss of the brain. Electronically Signed   By: Mitzi Hansen M.D.   On: 01/19/2018 13:38   Dg Chest Port 1 View  Result Date: 01/14/2018 CLINICAL DATA:  Weakness, pneumonia EXAM: PORTABLE CHEST 1 VIEW COMPARISON:  01/09/2018 FINDINGS: Cardiomegaly with vascular congestion. No overt edema. No confluent opacities or effusions. No acute bony abnormality. IMPRESSION: Cardiomegaly, vascular congestion. Electronically Signed   By: Charlett Nose M.D.   On: 01/14/2018 14:22   Dg Abd Portable 1 View  Result Date: 01/09/2018 CLINICAL DATA:  Generalized abdominal pain for 1 week EXAM: PORTABLE ABDOMEN - 1 VIEW COMPARISON:  Lumbar spine radiographs from 12/08/2017 FINDINGS: Indistinct left hemidiaphragm, possibly from a left lower lobe airspace opacity. Mild blunting of the right lateral costophrenic angle. Gas and likely formed stool in the colon.  No dilated bowel. Aortoiliac atherosclerotic vascular disease. Lumbar spondylosis and degenerative disc disease. Old compression fracture at L1. IMPRESSION: 1. Unremarkable bowel gas pattern. 2. Indistinct left hemidiaphragm, cannot exclude left lower lobe consolidation. 3. Mild blunting of the right lateral costophrenic angle suggesting a small right pleural effusion. 4. Lumbar spondylosis and degenerative disc disease. Old compression fracture at L1. 5.  Aortic Atherosclerosis (ICD10-I70.0). Electronically Signed   By: Gaylyn Rong M.D.   On: 01/09/2018 16:37    Microbiology: Recent Results (from the past 240 hour(s))  Urine culture     Status: None   Collection Time: 01/18/18  1:43 AM  Result Value Ref Range Status   Specimen Description URINE, CATHETERIZED  Final   Special Requests NONE  Final   Culture   Final    NO GROWTH Performed  at [facility], [address] [place], [place] [address]    Report Status 01/19/2018 FINAL  Final  Culture, blood (routine x 2) Call MD if unable to obtain prior to antibiotics being given     Status: None   Collection Time: 01/18/18  9:00 AM  Result Value Ref Range Status   Specimen Description BLOOD LEFT ARM  Final   Special Requests   Final    BOTTLES DRAWN AEROBIC AND ANAEROBIC Blood Culture adequate volume   Culture   Final    NO GROWTH 5 DAYS Performed at [facility], [address] [place], [place] [address]    Report Status 01/23/2018 FINAL  Final  Culture, blood (routine x 2) Call MD if unable to obtain prior to antibiotics being given     Status: None   Collection Time: 01/18/18 11:48 AM  Result Value Ref Range Status   Specimen Description BLOOD LEFT ARM  Final   Special Requests   Final    BOTTLES DRAWN AEROBIC ONLY Blood Culture results may not be optimal due to an inadequate volume of blood received in culture bottles   Culture   Final    NO GROWTH  5 DAYS Performed at [facility], [address], [place], [place] [address]    Report Status 01/23/2018 FINAL  Final     Labs: CBC: Recent Labs  Lab 01/19/18 0540 01/22/18 0306  WBC 6.1 5.4  NEUTROABS 3.5  --   HGB 11.6* 11.9*  HCT 36.2 38.7  MCV 88.1 88.0  PLT 269 239   Basic Metabolic Panel: Recent Labs  Lab 01/19/18 0540 01/20/18 0337 01/22/18 0306  NA 141  --  141  K 3.8  --  3.5  CL 107  --  107  CO2 21*  --  23  GLUCOSE 105*  --  102*  BUN 11  --  14  CREATININE 0.97  --  0.89  CALCIUM 9.0  --  9.4  MG  --  1.9  --    Liver Function Tests: No results for input(s): AST, ALT, ALKPHOS, BILITOT, PROT, ALBUMIN in the last 168 hours. No results for input(s): LIPASE, AMYLASE in the last 168 hours. Recent Labs  Lab 01/19/18 1124  AMMONIA 33   Cardiac Enzymes: No results for input(s): CKTOTAL, CKMB, CKMBINDEX, TROPONINI in the last 168 hours. BNP (last 3 results) Recent  Labs    01/09/18 1616 01/11/18 0239 01/18/18 0144  BNP 267.3* 131.6* 161.0*   CBG: No results for input(s): GLUCAP in the last 168 hours. Time spent: 35 minutes  Signed:  Lynden Oxford  [facility]  01/25/2018  , 9:32 AM

## 2018-01-25 NOTE — Clinical Social Work Note (Signed)
Clinical Social Worker facilitated patient discharge including contacting patient family and facility to confirm patient discharge plans.  Clinical information faxed to facility and family agreeable with plan.  CSW arranged ambulance transport via [facility] [place] [facility] [place].  RN to call [phone] for report prior to discharge.  Clinical Social Worker will sign off for now as social work intervention is no longer needed. Please consult us again if new need arises.  KeeftonBridget Kesler [place], [place] [phone]

## 2018-01-25 NOTE — Clinical Social Work Placement (Signed)
   CLINICAL SOCIAL WORK PLACEMENT  NOTE  Date:  01/25/2018  Patient Details  Name: Amanda Atkins MRN: [id number] Date of Birth: 1924/06/18  Clinical Social Work is seeking post-discharge placement for this patient at the Skilled  Nursing Facility level of care (*CSW will initial, date and re-position this form in  chart as items are completed):      Patient/family provided with [facility] Clinical Social Work Department's list of facilities offering this level of care within the geographic area requested by the patient (or if unable, by the patient's family).  Yes   Patient/family informed of their freedom to choose among providers that offer the needed level of care, that participate in Medicare, Medicaid or managed care program needed by the patient, have an available bed and are willing to accept the patient.      Patient/family informed of [facility]'s ownership interest in [facility] and [facility], as well as of the fact that they are under no obligation to receive care at these facilities.  PASRR submitted to EDS on       PASRR number received on 01/19/18     Existing PASRR number confirmed on 01/19/18     FL2 transmitted to all facilities in geographic area requested by pt/family on 01/19/18     FL2 transmitted to all facilities within larger geographic area on       Patient informed that his/her managed care company has contracts with or will negotiate with certain facilities, including the following:        Yes   Patient/family informed of bed offers received.  Patient chooses bed at ([facility])     Physician recommends and patient chooses bed at      Patient to be transferred to ([facility]) on 01/25/18.  Patient to be transferred to facility by [facility]     Patient family notified on 01/25/18 of transfer.  Name of family member notified:  [name]     PHYSICIAN       Additional Comment:    _______________________________________________ Maree KrabbeBridget  A Jaylynn Mcaleer, LCSW 01/25/2018, 9:22 AM

## 2018-02-14 ENCOUNTER — Ambulatory Visit
Admission: RE | Admit: 2018-02-14 | Discharge: 2018-02-14 | Disposition: A | Discharge status: Home / Self Care | Payer: Medicare Other | Source: Ambulatory Visit | Attending: Internal Medicine | Admitting: Internal Medicine

## 2018-02-14 ENCOUNTER — Telehealth: Payer: Self-pay | Admitting: Internal Medicine

## 2018-02-14 ENCOUNTER — Encounter: Payer: Self-pay | Admitting: Internal Medicine

## 2018-02-14 ENCOUNTER — Ambulatory Visit (INDEPENDENT_AMBULATORY_CARE_PROVIDER_SITE_OTHER): Payer: Medicare Other | Admitting: Internal Medicine

## 2018-02-14 VITALS — BP 120/80 | HR 89 | Temp 98.2°F | Resp 34 | Ht 59.0 in | Wt 110.0 lb

## 2018-02-14 DIAGNOSIS — F0151 Vascular dementia with behavioral disturbance: Secondary | ICD-10-CM

## 2018-02-14 DIAGNOSIS — I5022 Chronic systolic (congestive) heart failure: Secondary | ICD-10-CM | POA: Diagnosis not present

## 2018-02-14 DIAGNOSIS — R0602 Shortness of breath: Secondary | ICD-10-CM | POA: Diagnosis not present

## 2018-02-14 DIAGNOSIS — F01518 Vascular dementia, unspecified severity, with other behavioral disturbance: Secondary | ICD-10-CM

## 2018-02-14 NOTE — Progress Notes (Signed)
Subjective:    Patient ID: Amanda Atkins, female    DOB: 1924-12-03, 84 y.o.   MRN: [id number]  HPI Her daughter called and was concerned that patient was short of breath.  Apparently she was in a rehab facility until recently but was released to go back home.  She has sitters around-the-clock.  A sitter is with her today at the office.  Her daughter was at work and not able to come.  She was admitted to the hospital December 3 through December 10 after which she was hospitalized at [facility] on [address] for rehab according to her sitter.  I do not have records from that facility today.  She has paroxysmal atrial fibrillation and is being maintained on Cardizem 30 mg every 6 hours and also 12.5 mg of Seroquel at bedtime for agitation.  She has a history of dementia.  Was placed on Lasix 20 mg for heart failure once weekly and potassium 20 mEq once weekly.  On December 3 she had a CT of the chest showing no evidence of pulmonary emboli.  She had cardiomegaly and aortic atherosclerosis.  There were central groundglass opacities and mild bilateral interstitial/interlobular septal thickening nonspecific but suggestive of edema.  There were trace bilateral pleural effusions and small pericardial effusion.  She had MRI of the brain on December 4 showing no acute stroke or mass-effect.  Chronic microvascular ischemic changes and volume loss of the brain.  BNP on December 3 was 161.  White count was normal in the hospital with hemoglobin of 11.6 g.  B12 level was normal as well as ammonia and magnesium.    Review of Systems see above     Objective:   Physical Exam Vital signs reviewed.  She is alert but disoriented.  She is very cooperative.  She seems to have a lot of energy today.  She walks fairly quickly down the hall but when she does so on room air her pulse oximetry drops to 88%  She is in no acute distress at rest.  She is in atrial fib.  EKG shows  that and she has irregular irregular pulse but rate is controlled.  She has JVD at 45 degrees.  No carotid bruits are appreciated.  No significant lower extremity edema.  B met and BNP drawn and are pending.  30 minutes spent with patient and sitter today.  Vena puncture was attempted by CMA but patient screamed and needle was withdrawn.  MD accomplished vena puncture using butterfly.  Lucretia Roers spoke with daughter by phone to inform her that patient was going to have labs and repeat chest x-ray. Repeat chest x-ray shows chronic enlargement of the cardiac silhouette with mild prominence in the right infrahilar region that has improved.  Findings may reflect residual pneumonia and/or low-grade pulmonary edema.  With JVD present I am thinking she has congestive heart failure.  She also has hypoxia with exercise.      Assessment & Plan:  Congestive heart failure  Dementia  Plan: Lasix will be increased to 20 mg 3 times weekly.  Continue potassium once weekly.  She will need follow-up in 2 weeks or sooner if worse.  I am not sure she is going to be able to be managed at home.  She is comfortable at rest so I am not going to order home oxygen at this point in time.  I am not sure she would wear it.  Continue Seroquel for agitation.

## 2018-02-14 NOTE — Telephone Encounter (Signed)
Scheduled

## 2018-02-14 NOTE — Telephone Encounter (Signed)
Spoke with daughter, Bjorn LoserRhonda and advised that patient is in CHF and we are going to increase her Lasix, 20mg  to 3x/week (M-W-F).  Continue to take Potassium weekly.  She had chest x-ray today which confirmed CHF.  She had labs today as well, they have not resulted yet.  We will call with results on Thursday.  No other changes at this point.  Patient to return to clinic on 03/01/18 @ 2:45.    She verbalized understanding of our conversation.

## 2018-02-14 NOTE — Telephone Encounter (Signed)
Have her come in get EKG and I will see her. May still need to be hospitalized

## 2018-02-14 NOTE — Telephone Encounter (Signed)
[facility] that Dr. Lenord FellersBaxley will work patient in at 2:00pm.

## 2018-02-14 NOTE — Patient Instructions (Addendum)
BNP and B- met pending.  Increase Lasix to 20 mg 3 times weekly and continue potassium once weekly.  Follow-up in 2 weeks or sooner if necessary.  Continue Seroquel for agitation.
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
## 2018-02-15 LAB — COMPLETE METABOLIC PANEL WITH GFR
AG Ratio: 1.5 (calc) (ref 1.0–2.5)
ALT: 12 U/L (ref 6–29)
AST: 20 U/L (ref 10–35)
Albumin: 3.8 g/dL (ref 3.6–5.1)
Alkaline phosphatase (APISO): 76 U/L (ref 33–130)
BUN / CREAT RATIO: 17 (calc) (ref 6–22)
BUN: 18 mg/dL (ref 7–25)
CO2: 24 mmol/L (ref 20–32)
Calcium: 9.5 mg/dL (ref 8.6–10.4)
Chloride: 105 mmol/L (ref 98–110)
Creat: 1.07 mg/dL — ABNORMAL HIGH (ref 0.60–0.88)
GFR, Est African American: 53 mL/min/{1.73_m2} — ABNORMAL LOW (ref >=60)
GFR, Est Non African American: 45 mL/min/{1.73_m2} — ABNORMAL LOW (ref >=60)
GLUCOSE: 100 mg/dL — AB (ref 65–99)
Globulin: 2.5 g/dL (calc) (ref 1.9–3.7)
Potassium: 4.5 mmol/L (ref 3.5–5.3)
Sodium: 140 mmol/L (ref 135–146)
Total Bilirubin: 0.5 mg/dL (ref 0.2–1.2)
Total Protein: 6.3 g/dL (ref 6.1–8.1)

## 2018-02-15 LAB — BRAIN NATRIURETIC PEPTIDE: Brain Natriuretic Peptide: 271 pg/mL — ABNORMAL HIGH (ref <100)

## 2018-02-18 ENCOUNTER — Other Ambulatory Visit: Payer: Self-pay | Admitting: Internal Medicine

## 2018-02-22 ENCOUNTER — Telehealth: Payer: Self-pay | Admitting: Internal Medicine

## 2018-02-22 NOTE — Telephone Encounter (Signed)
Patient's daughter called requesting a referral be sent for the patient to be seen by a cardiologist due to her diagnosis of congestive heart failure.  Daughter, Bjorn Loser, can be reached at [phone].

## 2018-02-22 NOTE — Telephone Encounter (Signed)
Please make referral if not already done

## 2018-02-25 ENCOUNTER — Other Ambulatory Visit (HOSPITAL_COMMUNITY): Payer: Self-pay | Admitting: Internal Medicine

## 2018-02-25 ENCOUNTER — Other Ambulatory Visit: Payer: Self-pay

## 2018-02-25 MED ORDER — FUROSEMIDE 20 MG PO TABS: ORAL_TABLET | ORAL | 1 refills | Status: DC

## 2018-03-01 ENCOUNTER — Ambulatory Visit (INDEPENDENT_AMBULATORY_CARE_PROVIDER_SITE_OTHER): Payer: Medicare Other | Admitting: Internal Medicine

## 2018-03-01 ENCOUNTER — Encounter: Payer: Self-pay | Admitting: Internal Medicine

## 2018-03-01 VITALS — BP 120/80 | HR 63 | Temp 98.1°F | Ht 59.5 in | Wt 118.0 lb

## 2018-03-01 DIAGNOSIS — R0602 Shortness of breath: Secondary | ICD-10-CM

## 2018-03-01 DIAGNOSIS — R413 Other amnesia: Secondary | ICD-10-CM

## 2018-03-01 DIAGNOSIS — I5022 Chronic systolic (congestive) heart failure: Secondary | ICD-10-CM | POA: Diagnosis not present

## 2018-03-01 DIAGNOSIS — I4811 Longstanding persistent atrial fibrillation: Secondary | ICD-10-CM | POA: Diagnosis not present

## 2018-03-01 LAB — BASIC METABOLIC PANEL
BUN/Creatinine Ratio: 15 (calc) (ref 6–22)
BUN: 15 mg/dL (ref 7–25)
CO2: 23 mmol/L (ref 20–32)
Calcium: 9.4 mg/dL (ref 8.6–10.4)
Chloride: 104 mmol/L (ref 98–110)
Creat: 0.99 mg/dL — ABNORMAL HIGH (ref 0.60–0.88)
Glucose, Bld: 113 mg/dL — ABNORMAL HIGH (ref 65–99)
POTASSIUM: 4.5 mmol/L (ref 3.5–5.3)
Sodium: 140 mmol/L (ref 135–146)

## 2018-03-01 NOTE — Progress Notes (Signed)
   Subjective:    Patient ID: Amanda Atkins, female    DOB: 11-02-1924, 85 y.o.   MRN: [id number]  HPI  85 year old Female with history of atrial fibrillation dementia, congestive heart failure in today for follow-up on shortness of breath.  Last seen December 30.  At one point was scheduled to go to rehab facility but was not placed in facility at time of discharge on December 10.  We received a call from daughter on December 30 saying she thought patient was in atrial fib.  We saw her that day and noticed that her O2 sats dropped with exercise.  A BNP was obtained and proved to be 271.  However I went ahead and started her on some Lasix.  Creatinine was normal.  Sodium and potassium are normal.  She is here today for follow-up.  Daughter wants her seen by cardiologist.  I think that is reasonable.  Her O2 sat at rest is 93%. Previously was not thought to be a candidate for anticoagulation with history of falls.  She is accompanied by a sitter today.  Daughter is at work.   Review of Systems she has no particular complaints except shortness of breath with exertion.  Denies chest pain.  Does not seem to have cough.     Objective:   Physical Exam She weighs 118 pounds.  Temperature 98.1 degrees, pulse oximetry 93% but drops in the 80s with exercise.  Pulse is 63 regular.  Neck is supple.  Chest clear to auscultation.  Cardiac exam irregular irregular rhythm but rate is controlled at the present time.       Assessment & Plan:  Hypoxemia with exercise-I still think she has an element of congestive heart failure.  She had a chest x-ray December 30 that showed chronic enlargement of the cardiac silhouette without pulmonary edema.  She had mild interstitial prominence in the right infrahilar region that had improved at the left lung base.  This was thought to represent residual pneumonia or low-grade asymmetric pulmonary edema.  Atrial fibrillation  Dementia  Plan: Continue with Lasix.   Continue to ask daughter to consider skilled nursing placement if she is having difficulty handling her at home.  20 minutes spent with patient and sitter

## 2018-03-01 NOTE — Patient Instructions (Signed)
Basic metabolic panel drawn and pending.  Continue current medications.  Cardiology referral has been made.

## 2018-03-02 ENCOUNTER — Other Ambulatory Visit: Payer: Self-pay

## 2018-03-02 MED ORDER — FUROSEMIDE 20 MG PO TABS: ORAL_TABLET | ORAL | 1 refills | Status: AC

## 2018-03-03 ENCOUNTER — Telehealth: Payer: Self-pay | Admitting: Internal Medicine

## 2018-03-03 NOTE — Telephone Encounter (Signed)
Order oxygen at 2 liters per minute. Do not think this is all heart failure. Drops oxygen sat with exercise. Have written order. They may have to pay for it. Is she wanting to call Hospice/ Palliative Care since she plans to keep her at home?

## 2018-03-03 NOTE — Telephone Encounter (Signed)
Spoke with daughter, Bjorn Loser and advised that I have an Rx for O2 for her to pick up.  She'll pick this up on Friday.  And, she has been instructed on where she can take this to get patient started on O2.  Advised she may have to pay OOP for the O2.    Also advised that Dr. Lenord Fellers would like for patient to begin Hospice Palliative Care since she is wanting to keep patient at home rather than in an Assisted Living situation.  She is agreeable to this.  Will make that referral for her.

## 2018-03-03 NOTE — Telephone Encounter (Signed)
Bjorn Loser is calling to say that patient is very SOB today.  States that caregiver called her to say that she is much worse today and she's worried.  [name] called Cardiology office to see if they could see her sooner than 2/18 ([place]), but they said they didn't have anything sooner.    She wants to know if she could get her on Oxygen?  I explained it wasn't as simple as just calling [facility] and requesting it.  She would have to have her levels measured and an order sent over by the doctor, etc.    She then wanted to know if she shouldn't be on Lasix every day?  I told her that the concern was depleting her Potassium which would complicate the heart issues further.  She then wanted to know can't she be put on a potassium supplement?    I told her that I would need to have a conversation with you to find out what you thought was the best thing to do at this point.  She said she does NOT want to have to take her back to the hospital.    Please advise.   Thank you.

## 2018-03-04 NOTE — Telephone Encounter (Signed)
Referral has been made and patient has appointment.  Daughter is aware.

## 2018-03-13 ENCOUNTER — Encounter: Payer: Self-pay | Admitting: Internal Medicine

## 2018-04-05 ENCOUNTER — Encounter: Payer: Self-pay | Admitting: Cardiovascular Disease

## 2018-04-05 ENCOUNTER — Ambulatory Visit (INDEPENDENT_AMBULATORY_CARE_PROVIDER_SITE_OTHER): Admitting: Cardiovascular Disease

## 2018-04-05 VITALS — BP 118/72 | HR 90 | Ht 59.5 in | Wt 122.4 lb

## 2018-04-05 DIAGNOSIS — I5032 Chronic diastolic (congestive) heart failure: Secondary | ICD-10-CM | POA: Diagnosis not present

## 2018-04-05 DIAGNOSIS — I482 Chronic atrial fibrillation, unspecified: Secondary | ICD-10-CM

## 2018-04-05 MED ORDER — DILTIAZEM HCL ER COATED BEADS 180 MG PO CP24: 180.0000 mg | ORAL_CAPSULE | Freq: Every day | ORAL | 3 refills | Status: AC

## 2018-04-05 NOTE — Patient Instructions (Signed)
Medication Instructions:  Your physician has recommended you make the following change in your medication:  STOP Diltiazem 30 mg RESTART Diltiazem (Cardizem) extended release 180 mg once daily  If you need a refill on your cardiac medications before your next appointment, please call your pharmacy.   Lab work: None Ordered   Testing/Procedures: None Ordered   Follow-Up: At [facility], you and your health needs are our priority.  As part of our continuing mission to provide you with exceptional heart care, we have created designated Provider Care Teams.  These Care Teams include your primary Cardiologist (physician) and Advanced Practice Providers (APPs -  Physician Assistants and Nurse Practitioners) who all work together to provide you with the care you need, when you need it. You will need a follow up appointment in:   As needed.  You may see Dr. Elease Hashimoto or one of the following Advanced Practice Providers on your designated Care Team: Tereso Newcomer, PA-C [place], [place] . Berton Bon, NP
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
## 2018-05-18 DEATH — deceased

## 2019-09-12 IMAGING — CT CT ANGIO CHEST
1 of 4 series · 19 of 32 positions shown · IV contrast (iopamidol)
Comparison: 01/18/2018 and prior chest radiographs

CLINICAL DATA: [AGE] female with acute shortness of breath.

EXAM:
CT ANGIOGRAPHY CHEST WITH CONTRAST
TECHNIQUE: Multidetector CT imaging of the chest was performed using the
standard protocol during bolus administration of intravenous
contrast. Multiplanar CT image reconstructions and MIPs were
obtained to evaluate the vascular anatomy.
CONTRAST:  36mL FJYAGO-7KZ IOPAMIDOL (FJYAGO-7KZ) INJECTION 76%

[Series 6: thins · axial · 0.61mm/px · z∈[+1108,+1353]mm · 19 of 271 slices shown]
[im 13/271  lung]
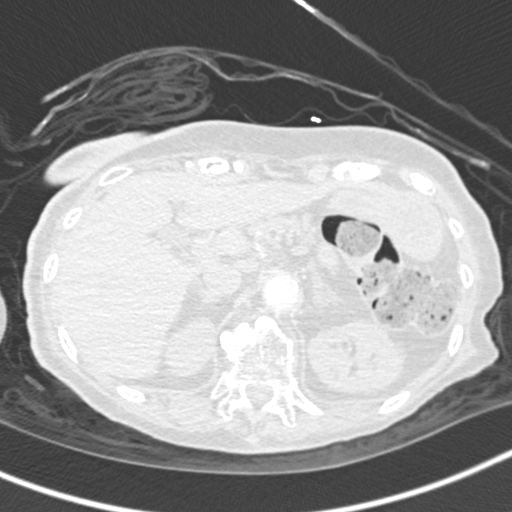
[im 25/271  soft-tissue]
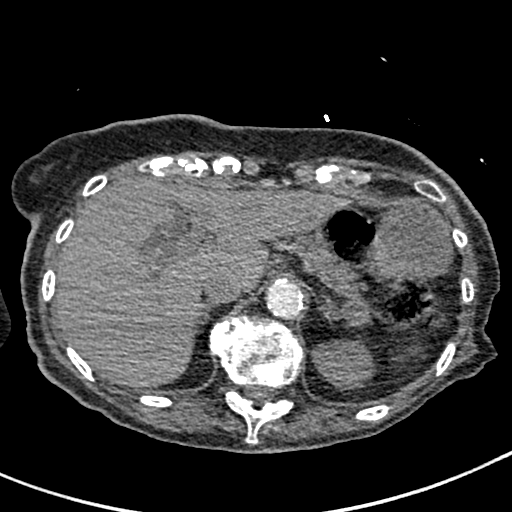
[im 37/271  lung]
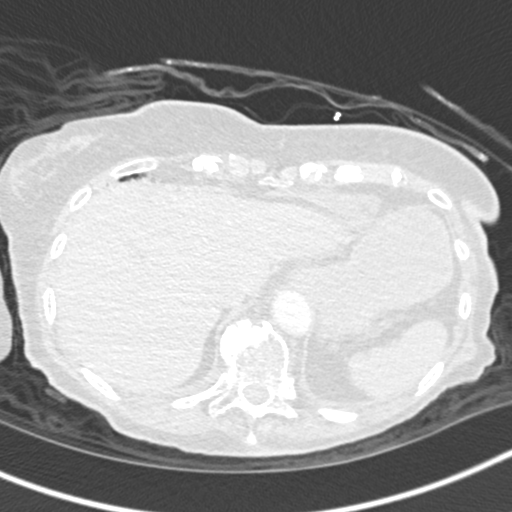
[im 50/271  soft-tissue]
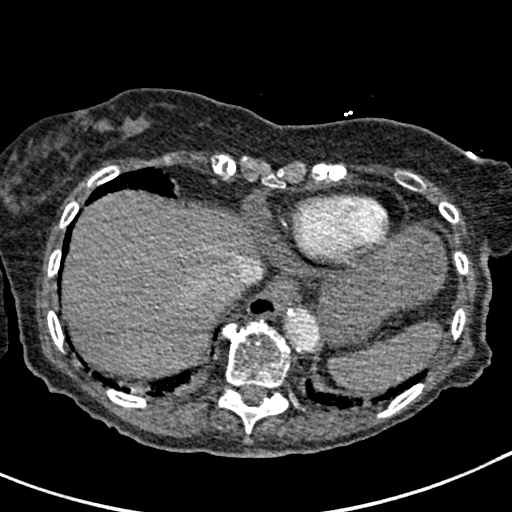
[im 62/271  lung]
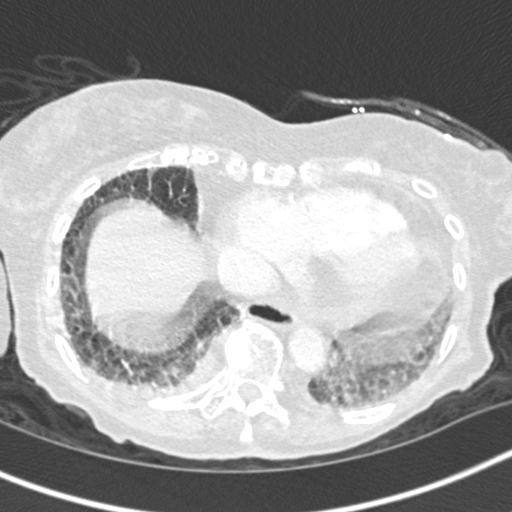
[im 86/271  soft-tissue]
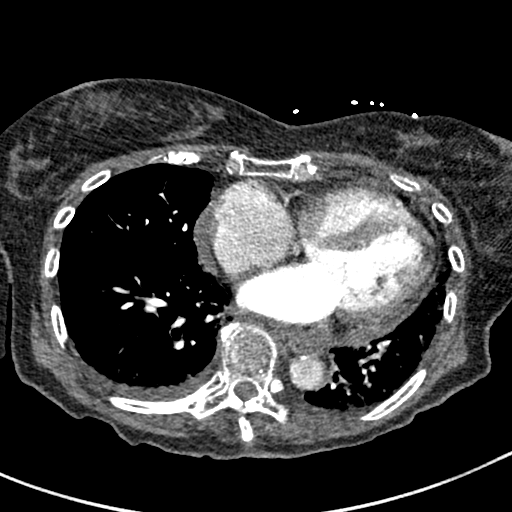
[im 99/271  lung]
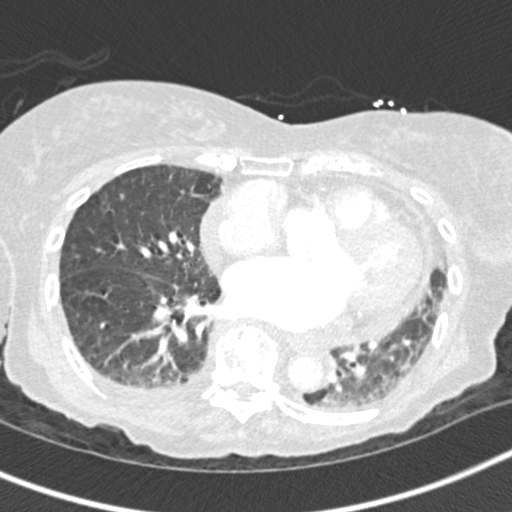
[im 111/271  soft-tissue]
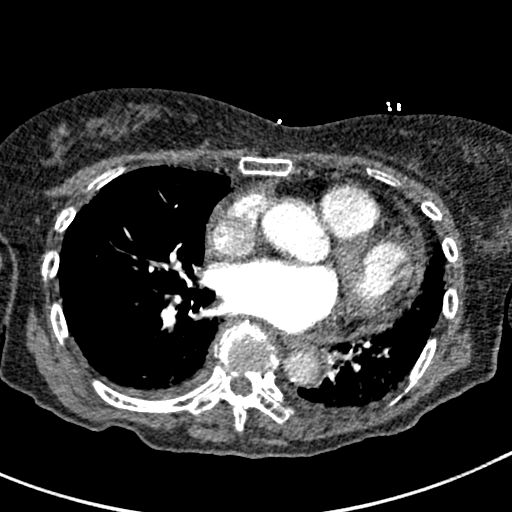
[im 123/271  lung]
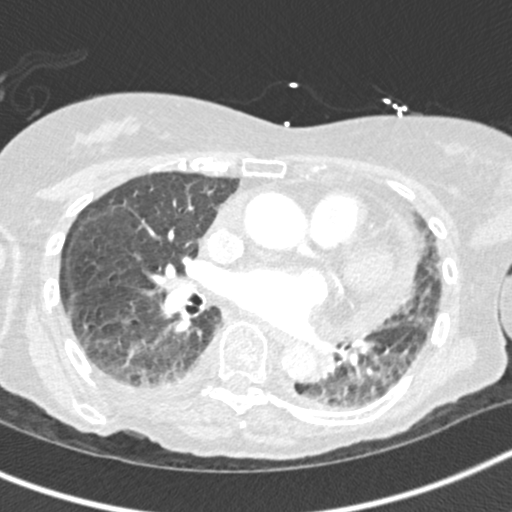
[im 136/271  soft-tissue]
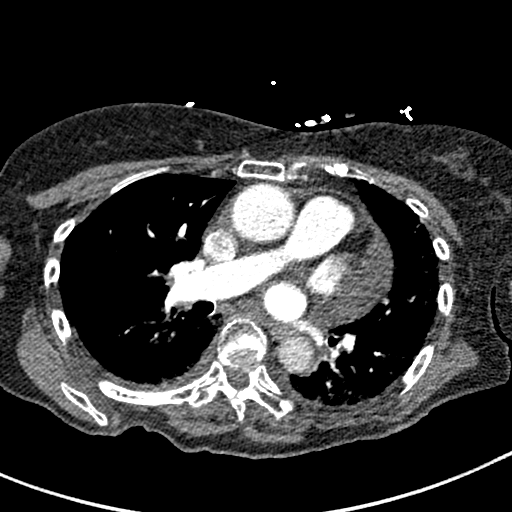
[im 148/271  lung]
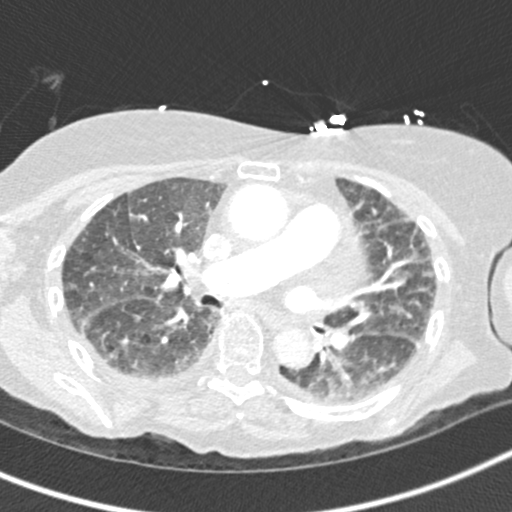
[im 160/271  soft-tissue]
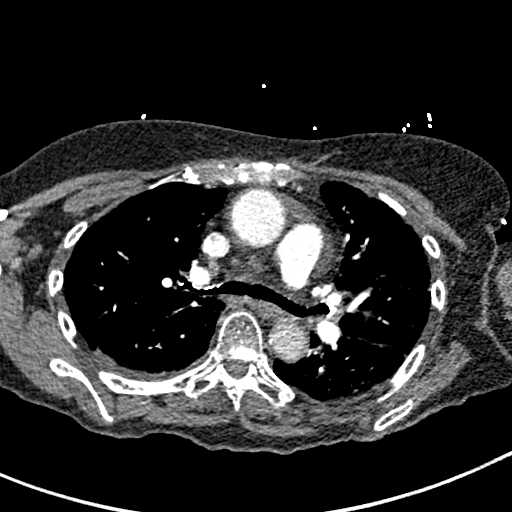
[im 172/271  lung]
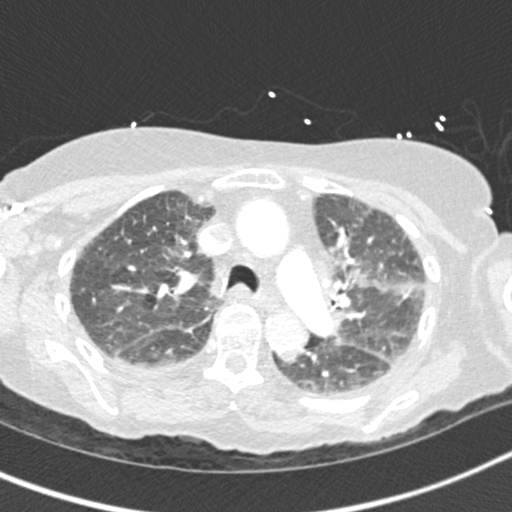
[im 185/271  soft-tissue]
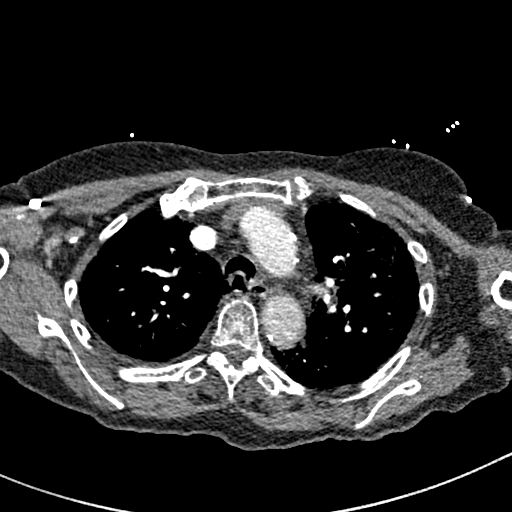
[im 209/271  lung]
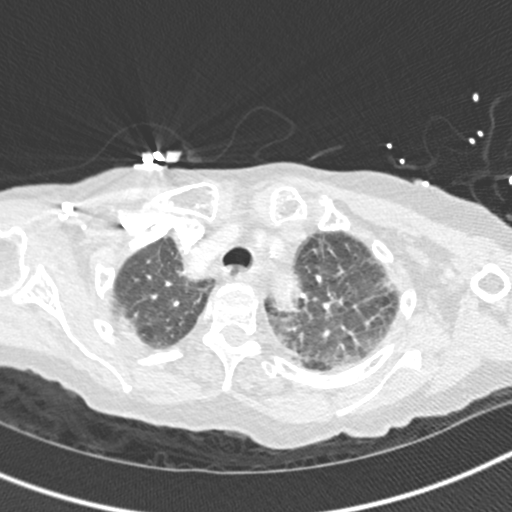
[im 221/271  soft-tissue]
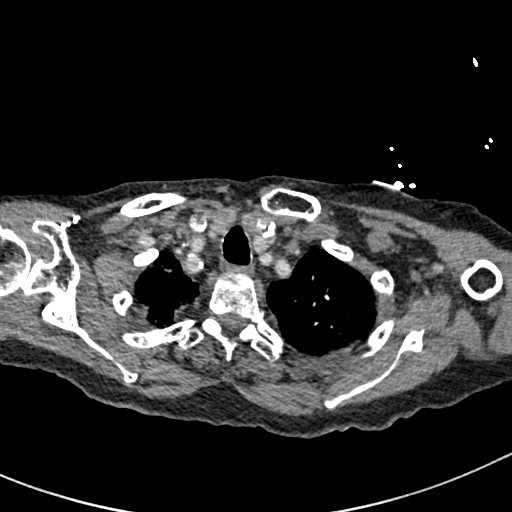
[im 234/271  lung]
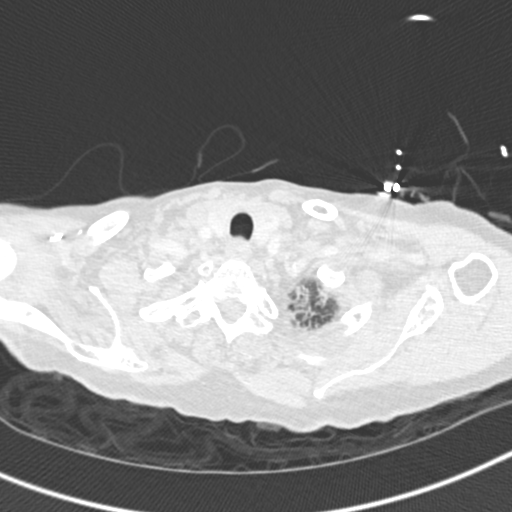
[im 246/271  soft-tissue]
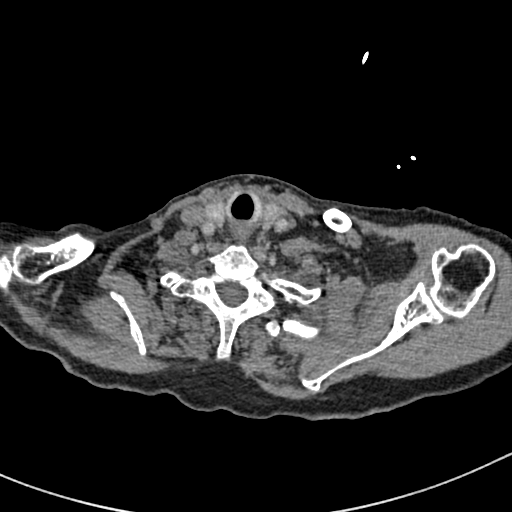
[im 258/271  lung]
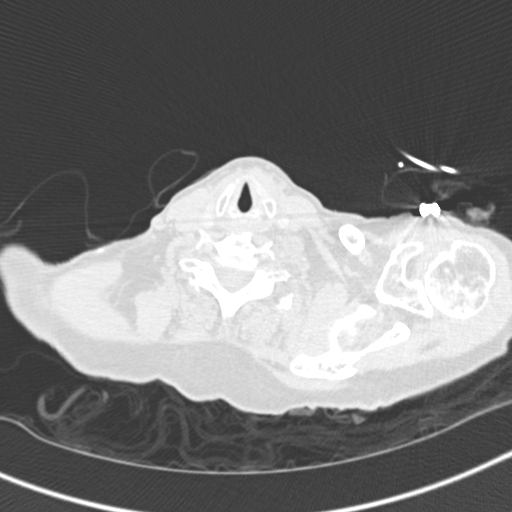

[19 of 32 positions shown; findings below may reference images not displayed]

FINDINGS: Cardiovascular: This is a technically adequate study. Respiratory
motion artifact slightly decreases sensitivity in the LOWER lungs.

No pulmonary emboli are identified. Cardiomegaly and small
pericardial effusion noted. Coronary artery and aortic
atherosclerotic calcifications are identified. Ectatic ascending
thoracic aorta noted without evidence of aneurysm.

Mediastinum/Nodes: No enlarged mediastinal, hilar, or axillary lymph
nodes. Thyroid gland, trachea, and esophagus demonstrate no
significant findings.

Lungs/Pleura: Central ground-glass opacities and mild bilateral
interstitial/interlobular septal thickening noted which suggests
edema. Trace bilateral pleural effusions are noted. No evidence of
definite airspace disease, consolidation, mass or pneumothorax.

Upper Abdomen: No acute abnormality

Musculoskeletal: No acute or suspicious bony abnormalities. Thoracic
compression fractures are unchanged from prior radiographs

Review of the MIP images confirms the above findings.
IMPRESSION: 1. No evidence of pulmonary emboli.
2. Central ground-glass opacities and mild bilateral
interstitial/interlobular septal thickening, nonspecific but
suggestive of edema. Trace bilateral pleural effusions and small
pericardial effusion.
3. Cardiomegaly and coronary artery disease.
4.  Aortic Atherosclerosis ([id number]).

## 2019-09-13 IMAGING — MR MR HEAD W/O CM
3 series · 48 of 48 positions shown · non-contrast
Comparison: 01/14/2018 CT head.

CLINICAL DATA: [AGE]/o  F; altered mental status.

EXAM:
MRI HEAD WITHOUT CONTRAST
TECHNIQUE: Axial DWI and axial T2 FLAIR propeller sequences were acquired.
Patient was unable to continue additional sequences were not
acquired.

[Series 3: DWI · axial · 3.0mm · 0.94mm/px · z∈[+1,+145]mm · 27 of 100 slices shown]
[im 1/100]
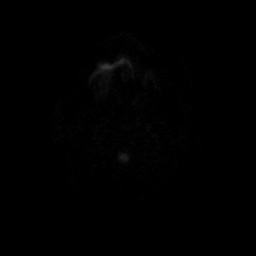
[im 4/100]
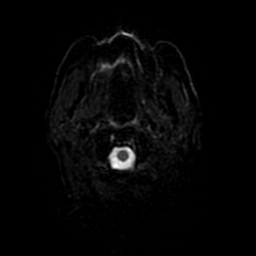
[im 8/100]
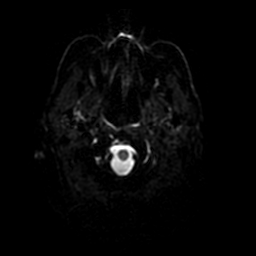
[im 12/100]
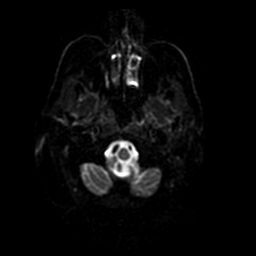
[im 16/100]
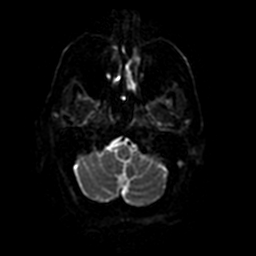
[im 20/100]
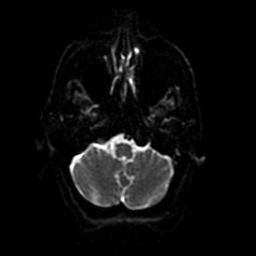
[im 23/100]
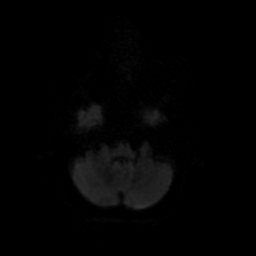
[im 27/100]
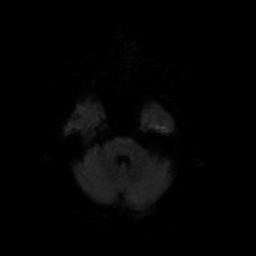
[im 31/100]
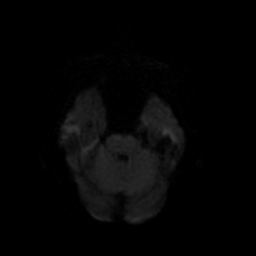
[im 35/100]
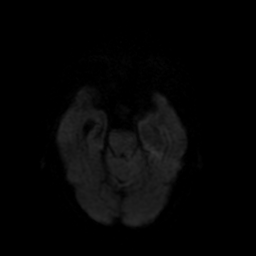
[im 39/100]
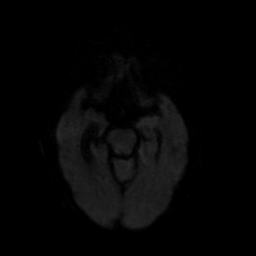
[im 42/100]
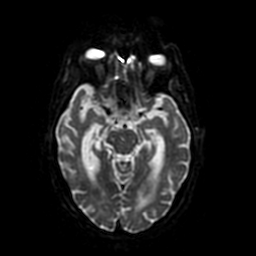
[im 46/100]
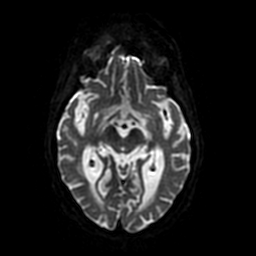
[im 50/100]
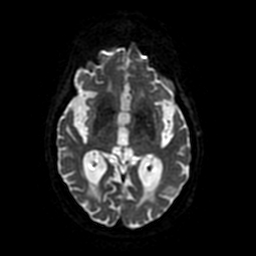
[im 54/100]
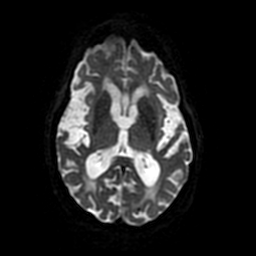
[im 58/100]
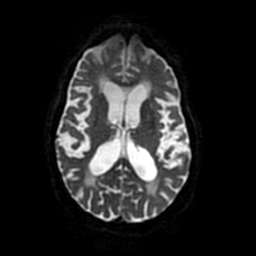
[im 61/100]
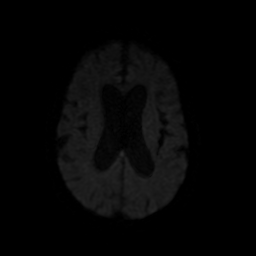
[im 65/100]
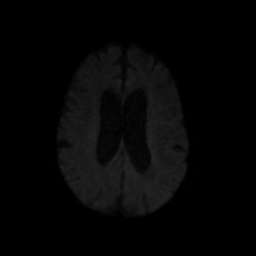
[im 69/100]
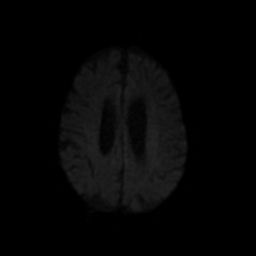
[im 73/100]
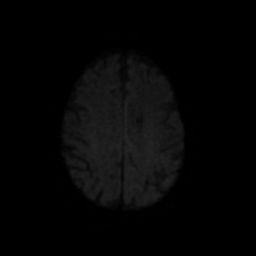
[im 77/100]
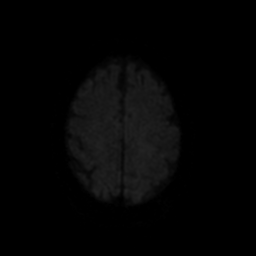
[im 80/100]
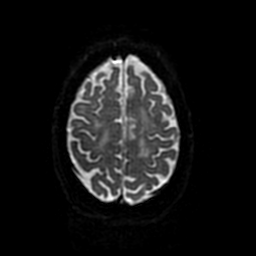
[im 84/100]
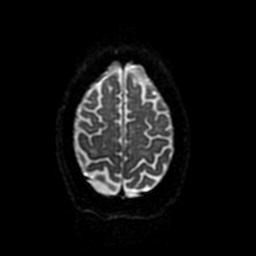
[im 88/100]
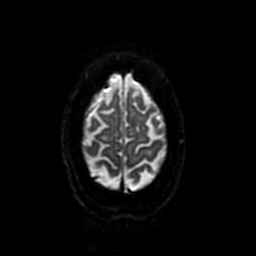
[im 92/100]
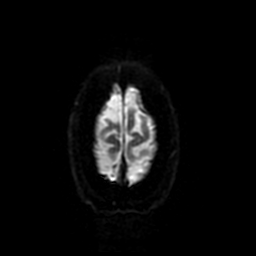
[im 96/100]
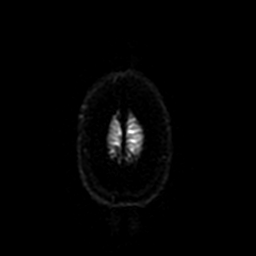
[im 100/100]
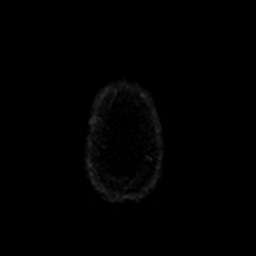

[Series 4: FLAIR · axial · 5.0mm · 0.47mm/px · z∈[-6,+136]mm · 7 of 25 slices shown]
[im 1/25]
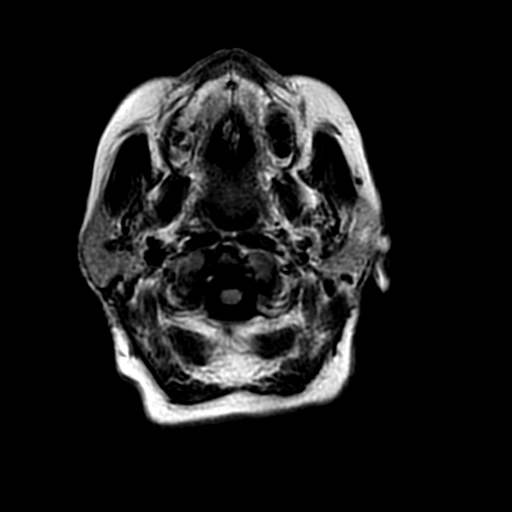
[im 5/25]
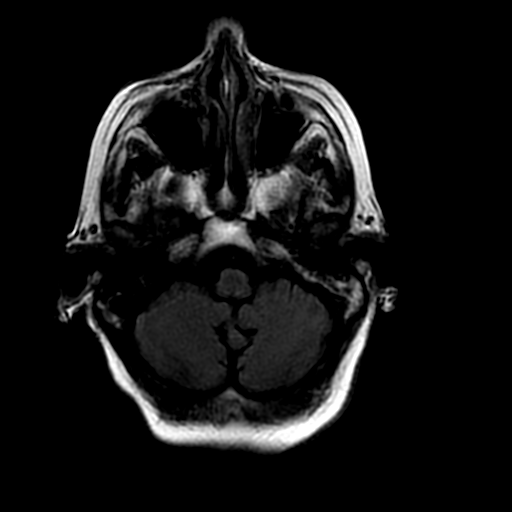
[im 9/25]
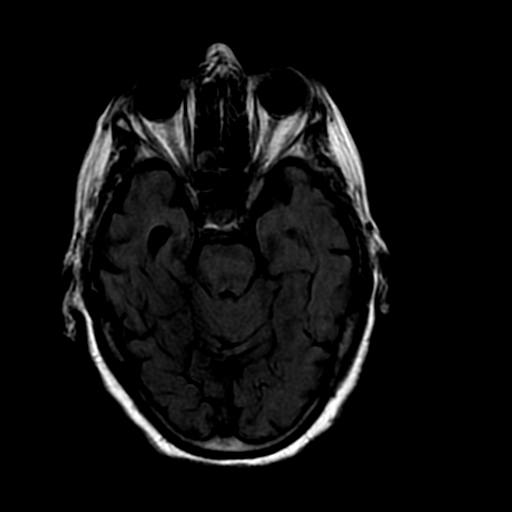
[im 13/25]
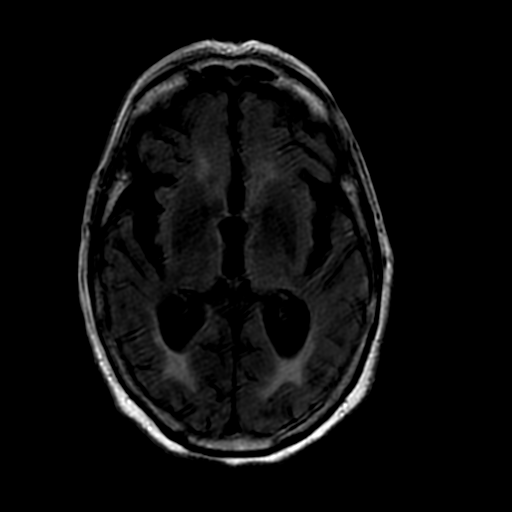
[im 17/25]
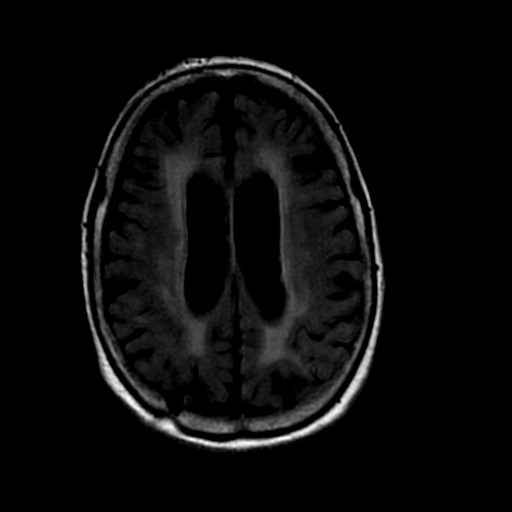
[im 21/25]
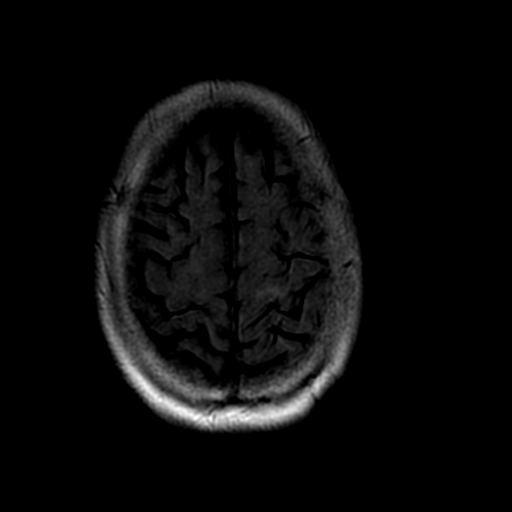
[im 25/25]
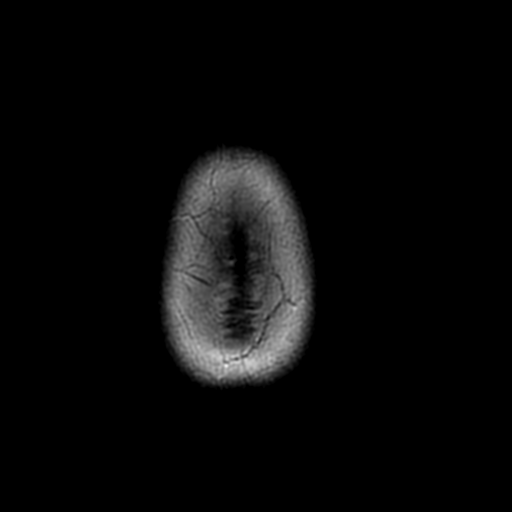

[Series 350: ADC · axial · 3.0mm · 0.94mm/px · z∈[+1,+145]mm · 14 of 50 slices shown]
[im 1/50]
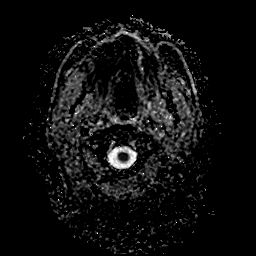
[im 4/50]
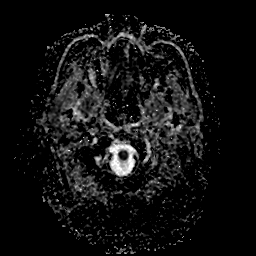
[im 8/50]
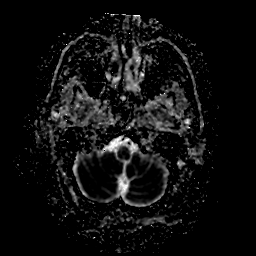
[im 12/50]
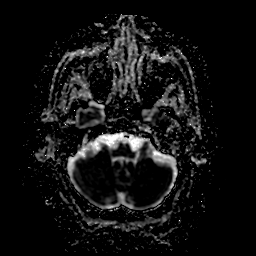
[im 16/50]
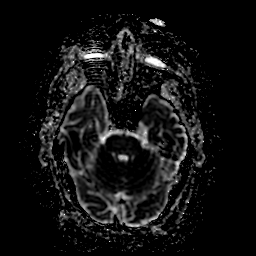
[im 19/50]
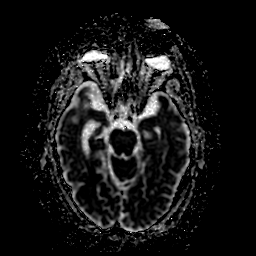
[im 23/50]
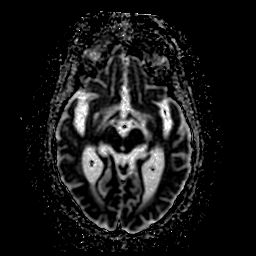
[im 27/50]
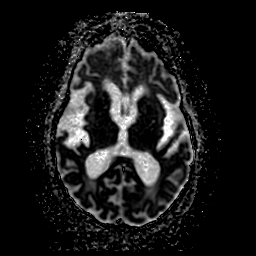
[im 31/50]
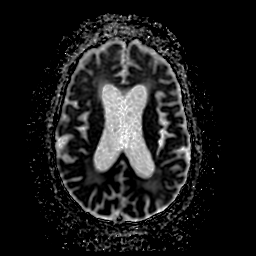
[im 34/50]
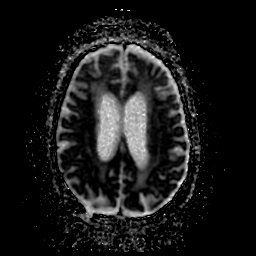
[im 38/50]
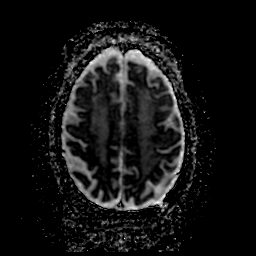
[im 42/50]
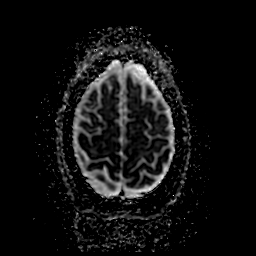
[im 46/50]
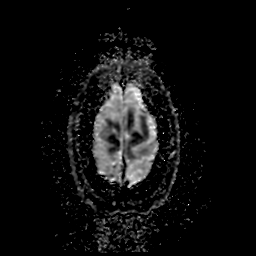
[im 50/50]
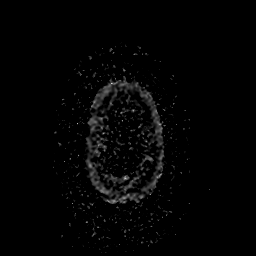

[48 of 48 positions shown; findings below may reference images not displayed]

FINDINGS: Moderate motion artifact. No reduced diffusion to suggest acute or
early subacute infarction. No extra-axial collection, hydrocephalus,
mass effect, or herniation identified the diffusion and FLAIR
sequences. Patchy nonspecific T2 FLAIR hyperintensities in
subcortical and periventricular white matter are compatible with
moderate chronic microvascular ischemic changes for age. Moderate
volume loss of the brain.

Mucous retention cyst in right posterior ethmoid air cells. Left
mastoid air cell opacification.
IMPRESSION: 1. Axial DWI and axial T2 FLAIR sequences were acquired. Moderate
motion artifact.
2. No acute stroke or mass effect.
3. Moderate chronic microvascular ischemic changes and volume loss
of the brain.
